# Patient Record
Sex: Female | Born: 1990 | Race: White | Hispanic: No | Marital: Single | Smoking: Never smoker
Health system: Southern US, Community
[De-identification: ages and names within clinical notes are randomized; demographics above are authoritative.]

## PROBLEM LIST (undated history)

## (undated) DIAGNOSIS — F32A Depression, unspecified: Secondary | ICD-10-CM

## (undated) DIAGNOSIS — T7840XA Allergy, unspecified, initial encounter: Secondary | ICD-10-CM

## (undated) DIAGNOSIS — E119 Type 2 diabetes mellitus without complications: Secondary | ICD-10-CM

## (undated) DIAGNOSIS — E282 Polycystic ovarian syndrome: Secondary | ICD-10-CM

## (undated) DIAGNOSIS — F329 Major depressive disorder, single episode, unspecified: Secondary | ICD-10-CM

## (undated) DIAGNOSIS — F419 Anxiety disorder, unspecified: Secondary | ICD-10-CM

## (undated) HISTORY — DX: Major depressive disorder, single episode, unspecified: F32.9

## (undated) HISTORY — DX: Depression, unspecified: F32.A

## (undated) HISTORY — DX: Allergy, unspecified, initial encounter: T78.40XA

## (undated) HISTORY — PX: DENTAL SURGERY: SHX609

## (undated) HISTORY — DX: Anxiety disorder, unspecified: F41.9

---
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
## 2012-01-12 ENCOUNTER — Encounter (HOSPITAL_COMMUNITY): Payer: Self-pay | Admitting: Emergency Medicine

## 2012-01-12 ENCOUNTER — Emergency Department (HOSPITAL_COMMUNITY)
Admission: EM | Admit: 2012-01-12 | Discharge: 2012-01-12 | Disposition: A | Discharge status: Home / Self Care | Payer: BC Managed Care – PPO | Attending: Emergency Medicine | Admitting: Emergency Medicine

## 2012-01-12 DIAGNOSIS — R112 Nausea with vomiting, unspecified: Secondary | ICD-10-CM

## 2012-01-12 DIAGNOSIS — R197 Diarrhea, unspecified: Secondary | ICD-10-CM | POA: Insufficient documentation

## 2012-01-12 DIAGNOSIS — R109 Unspecified abdominal pain: Secondary | ICD-10-CM | POA: Insufficient documentation

## 2012-01-12 DIAGNOSIS — Z8659 Personal history of other mental and behavioral disorders: Secondary | ICD-10-CM | POA: Insufficient documentation

## 2012-01-12 LAB — COMPREHENSIVE METABOLIC PANEL
BUN: 14 mg/dL (ref 6–23)
CO2: 22 mEq/L (ref 19–32)
Calcium: 9.4 mg/dL (ref 8.4–10.5)
Creatinine, Ser: 0.69 mg/dL (ref 0.50–1.10)
GFR calc Af Amer: >90 mL/min (ref >90)
GFR calc non Af Amer: >90 mL/min (ref >90)
Glucose, Bld: 144 mg/dL — ABNORMAL HIGH (ref 70–99)
Total Bilirubin: 0.6 mg/dL (ref 0.3–1.2)

## 2012-01-12 LAB — CBC WITH DIFFERENTIAL/PLATELET
Basophils Absolute: 0 10*3/uL (ref 0.0–0.1)
Eosinophils Absolute: 0 10*3/uL (ref 0.0–0.7)
Lymphocytes Relative: 6 % — ABNORMAL LOW (ref 12–46)
Lymphs Abs: 1.4 10*3/uL (ref 0.7–4.0)
Neutrophils Relative %: 90 % — ABNORMAL HIGH (ref 43–77)
Platelets: 285 10*3/uL (ref 150–400)
RBC: 5.33 MIL/uL — ABNORMAL HIGH (ref 3.87–5.11)
RDW: 13.5 % (ref 11.5–15.5)
WBC: 22 10*3/uL — ABNORMAL HIGH (ref 4.0–10.5)

## 2012-01-12 LAB — URINE MICROSCOPIC-ADD ON

## 2012-01-12 LAB — PREGNANCY, URINE: Preg Test, Ur: NEGATIVE

## 2012-01-12 LAB — URINALYSIS, ROUTINE W REFLEX MICROSCOPIC
Glucose, UA: NEGATIVE mg/dL
Hgb urine dipstick: NEGATIVE
Protein, ur: NEGATIVE mg/dL

## 2012-01-12 LAB — OCCULT BLOOD, POC DEVICE: Fecal Occult Bld: NEGATIVE

## 2012-01-12 MED ORDER — ONDANSETRON HCL 4 MG/2ML IJ SOLN
4.0000 mg | Freq: Once | INTRAMUSCULAR | Status: AC
  Administered 2012-01-12: 4 mg via INTRAVENOUS
  Filled 2012-01-12: qty 2

## 2012-01-12 MED ORDER — SODIUM CHLORIDE 0.9 % IV BOLUS (SEPSIS)
1000.0000 mL | Freq: Once | INTRAVENOUS | Status: AC
  Administered 2012-01-12: 1000 mL via INTRAVENOUS

## 2012-01-12 MED ORDER — ONDANSETRON 4 MG PO TBDP
4.0000 mg | ORAL_TABLET | Freq: Once | ORAL | Status: AC
  Administered 2012-01-12: 4 mg via ORAL
  Filled 2012-01-12: qty 1

## 2012-01-12 MED ORDER — MORPHINE SULFATE 4 MG/ML IJ SOLN
4.0000 mg | Freq: Once | INTRAMUSCULAR | Status: AC
  Administered 2012-01-12: 4 mg via INTRAVENOUS
  Filled 2012-01-12: qty 1

## 2012-01-12 NOTE — ED Notes (Signed)
Pt has active vomiting at this time; also has uncontrolled loose stool--- mother assisted pt to the BR.

## 2012-01-12 NOTE — ED Notes (Signed)
Pt was offered ginger ale at this time.

## 2012-01-12 NOTE — ED Notes (Signed)
Pt has been slowly taking sips of the ginger ale--- encouraged to drink more to monitor po tolerance.

## 2012-01-12 NOTE — ED Notes (Signed)
Pt drank the majority of the ginger ale, tolerated well; denies nausea. Pt states she feels better, states abdominal pain is "almost gone".

## 2012-01-12 NOTE — ED Provider Notes (Signed)
History     CSN: [id number]  Arrival date & time 01/12/12  0208   First MD Initiated Contact with Patient 01/12/12 0304      Chief Complaint  Patient presents with  . Nausea and vomiting     (Consider location/radiation/quality/duration/timing/severity/associated sxs/prior treatment) HPI Comments: Multiple episodes of vomiting in the past 3 hours several episodes of diarrhea several hours ago none since.   The history is provided by the patient.    Past Medical History  Diagnosis Date  . Allergy   . Depression     Past Surgical History  Procedure Date  . Fracture surgery 2006    right wrist, [facility]     Family History  Problem Relation Age of Onset  . Hypertension Mother   . Hyperlipidemia Mother   . Depression Mother   . Cholelithiasis Mother   . Diabetes Maternal Grandmother   . Heart disease Maternal Grandmother     CVA  . Diabetes Paternal Grandmother   . Coronary artery disease Paternal Grandfather   . Cancer Cousin     Leukemia    History  Substance Use Topics  . Smoking status: Never Smoker   . Smokeless tobacco: Not on file  . Alcohol Use: Not on file    OB History    Grav Para Term Preterm Abortions TAB SAB Ect Mult Living                  Review of Systems  Constitutional: Negative for fever and chills.  HENT: Negative.   Eyes: Negative.   Respiratory: Negative for shortness of breath.   Cardiovascular: Negative for chest pain.  Gastrointestinal: Positive for abdominal pain.  Genitourinary: Negative.   Skin: Negative for rash.  Neurological: Negative for dizziness.  Hematological: Negative.     Allergies  Penicillins  Home Medications  No current outpatient prescriptions on file.  BP 102/62  Pulse 82  Temp 98.3 F (36.8 C) (Oral)  Resp 18  SpO2 100%  Physical Exam  Constitutional: She is oriented to person, place, and time. She appears well-developed and well-nourished.  HENT:  Head: Normocephalic.  Eyes: Pupils are  equal, round, and reactive to light.  Neck: Normal range of motion.  Cardiovascular: Tachycardia present.   Pulmonary/Chest: Effort normal.  Abdominal: Soft. She exhibits no distension. There is no tenderness.  Musculoskeletal: Normal range of motion.  Neurological: She is alert and oriented to person, place, and time.  Skin: Skin is warm. There is pallor.    ED Course  Procedures (including critical care time)  Labs Reviewed  URINALYSIS, ROUTINE W REFLEX MICROSCOPIC - Abnormal; Notable for the following:    APPearance TURBID (*)     Specific Gravity, Urine 1.031 (*)     Bilirubin Urine SMALL (*)     Ketones, ur TRACE (*)     All other components within normal limits  COMPREHENSIVE METABOLIC PANEL - Abnormal; Notable for the following:    Sodium 133 (*)     Glucose, Bld 144 (*)     All other components within normal limits  CBC WITH DIFFERENTIAL - Abnormal; Notable for the following:    WBC 22.0 (*)     RBC 5.33 (*)     Neutrophils Relative 90 (*)     Neutro Abs 19.7 (*)     Lymphocytes Relative 6 (*)     All other components within normal limits  URINE MICROSCOPIC-ADD ON - Abnormal; Notable for the following:    Squamous  Epithelial / LPF FEW (*)     Bacteria, UA MANY (*)     All other components within normal limits  LIPASE, BLOOD  PREGNANCY, URINE  OCCULT BLOOD, POC DEVICE  LAB REPORT - SCANNED   No results found.   1. Nausea, vomiting, and diarrhea       MDM  Tolerating PO fluids        Arman Filter, NP 01/13/12 [phone]

## 2012-01-12 NOTE — ED Notes (Signed)
Pt alert, arrives from home, c/o nausea and emesis, onset this evening, denies recent ill exposures, resp even unlabored, skin pwd, emesis in triage, clear, in appearance

## 2012-01-12 NOTE — ED Notes (Signed)
Noted streaks of blood in pt's loose stool.

## 2012-01-12 NOTE — ED Provider Notes (Signed)
Received signout from Sabino Dick, NP.  He generally healthy 22 year old female presenting complaining of nausea vomiting and diarrhea which started this morning. She reports feeling sick to his stomach, had more than 5 bouts of nonbloody nonbilious vomitus, and 2 bouts of diarrhea. She also endorsed sharp, crampy diffuse abdominal pain.  Her symptoms has improved after receiving IV fluid. On exam her abdomen is soft, without focal point tenderness. Abdomen is nonsurgical. No hernia noted. No overlying skin changes noted. Patient appears nontoxic. Her labs are significant with a white count of 22 and a left shift. However her abdomen is not suggestive of appendicitis or other life-threatening emergencies.  Goal continue to monitor patient, continue IV fluid, and will reexamine patient. Patient is currently tolerates by mouth, and has an appetite.  Care discussed with my attending.    9:05 AM Pt felt better, able to tolerates PO.  Abdomen nontender on reexamination.  Pt has PCP which i encourage to f/u for further evaluation of her leukocytosis.  Otherwise pt stable for discharge.    BP 90/44  Pulse 86  Temp 98 F (36.7 C) (Oral)  Resp 21  SpO2 99%  I have reviewed nursing notes and vital signs. I personally reviewed the imaging tests through PACS system  I reviewed available ER/hospitalization records thought the EMR  Results for orders placed during the hospital encounter of 01/12/12  URINALYSIS, ROUTINE W REFLEX MICROSCOPIC      Component Value Range   Color, Urine YELLOW  YELLOW   APPearance TURBID (*) CLEAR   Specific Gravity, Urine 1.031 (*) 1.005 - 1.030   pH 5.5  5.0 - 8.0   Glucose, UA NEGATIVE  NEGATIVE mg/dL   Hgb urine dipstick NEGATIVE  NEGATIVE   Bilirubin Urine SMALL (*) NEGATIVE   Ketones, ur TRACE (*) NEGATIVE mg/dL   Protein, ur NEGATIVE  NEGATIVE mg/dL   Urobilinogen, UA 0.2  0.0 - 1.0 mg/dL   Nitrite NEGATIVE  NEGATIVE   Leukocytes, UA NEGATIVE  NEGATIVE    COMPREHENSIVE METABOLIC PANEL      Component Value Range   Sodium 133 (*) 135 - 145 mEq/L   Potassium 4.0  3.5 - 5.1 mEq/L   Chloride 98  96 - 112 mEq/L   CO2 22  19 - 32 mEq/L   Glucose, Bld 144 (*) 70 - 99 mg/dL   BUN 14  6 - 23 mg/dL   Creatinine, Ser 1.61  0.50 - 1.10 mg/dL   Calcium 9.4  8.4 - 09.6 mg/dL   Total Protein 7.7  6.0 - 8.3 g/dL   Albumin 4.0  3.5 - 5.2 g/dL   AST 16  0 - 37 U/L   ALT 7  0 - 35 U/L   Alkaline Phosphatase 83  39 - 117 U/L   Total Bilirubin 0.6  0.3 - 1.2 mg/dL   GFR calc non Af Amer >90  >90 mL/min   GFR calc Af Amer >90  >90 mL/min  LIPASE, BLOOD      Component Value Range   Lipase 28  11 - 59 U/L  CBC WITH DIFFERENTIAL      Component Value Range   WBC 22.0 (*) 4.0 - 10.5 K/uL   RBC 5.33 (*) 3.87 - 5.11 MIL/uL   Hemoglobin 14.9  12.0 - 15.0 g/dL   HCT 04.5  40.9 - 81.1 %   MCV 81.2  78.0 - 100.0 fL   MCH 28.0  26.0 - 34.0 pg   MCHC 34.4  30.0 - 36.0 g/dL   RDW 40.9  81.1 - 91.4 %   Platelets 285  150 - 400 K/uL   Neutrophils Relative 90 (*) 43 - 77 %   Neutro Abs 19.7 (*) 1.7 - 7.7 K/uL   Lymphocytes Relative 6 (*) 12 - 46 %   Lymphs Abs 1.4  0.7 - 4.0 K/uL   Monocytes Relative 4  3 - 12 %   Monocytes Absolute 0.9  0.1 - 1.0 K/uL   Eosinophils Relative 0  0 - 5 %   Eosinophils Absolute 0.0  0.0 - 0.7 K/uL   Basophils Relative 0  0 - 1 %   Basophils Absolute 0.0  0.0 - 0.1 K/uL  PREGNANCY, URINE      Component Value Range   Preg Test, Ur NEGATIVE  NEGATIVE  URINE MICROSCOPIC-ADD ON      Component Value Range   Squamous Epithelial / LPF FEW (*) RARE   Bacteria, UA MANY (*) RARE  OCCULT BLOOD, POC DEVICE      Component Value Range   Fecal Occult Bld NEGATIVE  NEGATIVE   No results found.    Fayrene Helper, PA-C 01/12/12 [phone]

## 2012-01-13 NOTE — ED Provider Notes (Signed)
Medical screening examination/treatment/procedure(s) were performed by non-physician practitioner and as supervising physician I was immediately available for consultation/collaboration.   Hanley Seamen, MD 01/13/12 2007

## 2012-09-11 ENCOUNTER — Ambulatory Visit (INDEPENDENT_AMBULATORY_CARE_PROVIDER_SITE_OTHER): Payer: BC Managed Care – PPO | Admitting: Family Medicine

## 2012-09-11 ENCOUNTER — Encounter: Payer: Self-pay | Admitting: Family Medicine

## 2012-09-11 VITALS — BP 112/82 | HR 77 | Temp 97.7°F | Ht 68.5 in | Wt 253.5 lb

## 2012-09-11 DIAGNOSIS — L700 Acne vulgaris: Secondary | ICD-10-CM | POA: Insufficient documentation

## 2012-09-11 DIAGNOSIS — L708 Other acne: Secondary | ICD-10-CM

## 2012-09-11 DIAGNOSIS — J069 Acute upper respiratory infection, unspecified: Secondary | ICD-10-CM | POA: Insufficient documentation

## 2012-09-11 NOTE — Assessment & Plan Note (Signed)
On hairline /forehead Disc use of greasy hair products - to use caution and cleanse forehead well after use (suggest salicylic acid cleanser) Try not to touch face  Update if not improved  Avoid hats and headbands

## 2012-09-11 NOTE — Progress Notes (Signed)
Subjective:    Patient ID: Samantha Ramos, female    DOB: 10-17-1990, 22 y.o.   MRN: [id number]  HPI Here with sinus problems  Mid last week - really sore throat - used chloraseptic spray and tylenol cold and flu  This week very stuffy nose and drainage (taking claritin)  Yesterday- green nasal discharge -today not as bad and is clear  May have had a fever one day (hot and cold)  Mother had symptoms the week before   Rash started at the hairline -- forehead and sides of face Usually does not get breakouts there  Uses a light foundation Avoids the sun  Not a lot of acne in the past  No pustules or cysts   No new products  Uses suave shampoo and conditioner  Uses a fructis product for frizziness  Does not wear hats  No sweatbands   Patient Active Problem List   Diagnosis Date Noted  . Viral upper respiratory infection 09/11/2012  . 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 URI (upper respiratory infection) 05/11/2011  . Abdominal pain 12/19/2010  . Chest pain 12/19/2010  . ANXIETY STATE, UNSPECIFIED 12/21/2009  . VISUAL CHANGES 12/21/2009  . VITAMIN D DEFICIENCY 03/19/2009  . ALLERGIC RHINITIS 05/06/2008  . VASOVAGAL SYNCOPE 05/06/2008  . OTHER ABNORMALITY OF URINATION 05/06/2008  . DYSPLASTIC NEVUS, BACK 06/13/2007  . ANXIETY, SITUATIONAL 05/10/2007  . NEOPLASM, SKIN, UNCERTAIN BEHAVIOR 02/14/2007  . OVERWEIGHT 02/14/2007  . DEPRESSION 02/14/2007  . COMMON MIGRAINE 02/14/2007   Past Medical History  Diagnosis Date  . Allergy   . Depression    Past Surgical History  Procedure Laterality Date  . Fracture surgery  2006    right wrist, [facility]    History  Substance Use Topics  . Smoking status: Never Smoker   . Smokeless tobacco: Not on file  . Alcohol Use: No   Family History  Problem Relation Age of Onset  . Hypertension Mother   . Hyperlipidemia Mother   . Depression Mother   . Cholelithiasis Mother   . Diabetes Maternal Grandmother   . Heart disease Maternal  Grandmother     CVA  . Diabetes Paternal Grandmother   . Coronary artery disease Paternal Grandfather   . Cancer Cousin     Leukemia   Allergies  Allergen Reactions  . Penicillins     REACTION: Rash   No current outpatient prescriptions on file prior to visit.   No current facility-administered medications on file prior to visit.    Review of Systems Review of Systems  Constitutional: Negative for fever, appetite change,  and unexpected weight change.  ENT pos for cong/rhinorrhea and sinus fullness/ neg for ear pain or drainage  Eyes: Negative for pain and visual disturbance.  Respiratory: Negative for wheeze and shortness of breath.  pos for slight dry cough Cardiovascular: Negative for cp or palpitations    Gastrointestinal: Negative for nausea, diarrhea and constipation.  Genitourinary: Negative for urgency and frequency.  Skin: Negative for pallor and pos for acne type rash on forehead    Neurological: Negative for weakness, light-headedness, numbness and headaches.  Hematological: Negative for adenopathy. Does not bruise/bleed easily.  Psychiatric/Behavioral: Negative for dysphoric mood. The patient is not nervous/anxious.         Objective:   Physical Exam  Constitutional: She appears well-developed and well-nourished. No distress.  obese and well appearing   HENT:  Head: Normocephalic and atraumatic.  Right Ear: External ear normal.  Left Ear: External ear normal.  Mouth/Throat: Oropharynx is clear and moist.  Nares are injected and congested  Mild maxillary sinus tenderness Throat clear   Eyes: Conjunctivae and EOM are normal. Pupils are equal, round, and reactive to light. Right eye exhibits no discharge. Left eye exhibits no discharge.  Neck: Normal range of motion. Neck supple.  Cardiovascular: Normal rate and regular rhythm.   Pulmonary/Chest: Breath sounds normal. No respiratory distress. She has no wheezes. She has no rales.  Lymphadenopathy:    She has  no cervical adenopathy.  Neurological: She is alert.  Skin: Skin is warm and dry. No pallor.  Comedones on forehead - with scant erythema  No pustules  On hairline only Scalp is clear     Psychiatric: She has a normal mood and affect.          Assessment & Plan:

## 2012-09-11 NOTE — Assessment & Plan Note (Signed)
With reassuring exam  Disc symptomatic care - see instructions on AVS - will continue mucinex D and claritin since they help Is already improved School note for yesterday and today Update if not starting to improve in a week or if worsening

## 2012-09-11 NOTE — Patient Instructions (Addendum)
For viral upper resp infection (cold) - drink lots of fluids and rest and continue mucinex D and also claritin if helpful  Nasal saline spray is also helpful  For forehead- I think you have acne / clogged pores -think about changing your hair product or wash forehead well after you do your hair  Use a product with salicylic acid  If no improvement please let me know

## 2012-12-28 ENCOUNTER — Ambulatory Visit (INDEPENDENT_AMBULATORY_CARE_PROVIDER_SITE_OTHER): Payer: BC Managed Care – PPO | Admitting: Internal Medicine

## 2012-12-28 VITALS — BP 118/76 | HR 77 | Temp 97.8°F | Resp 18 | Ht 69.0 in | Wt 257.0 lb

## 2012-12-28 DIAGNOSIS — R5381 Other malaise: Secondary | ICD-10-CM

## 2012-12-28 DIAGNOSIS — R05 Cough: Secondary | ICD-10-CM

## 2012-12-28 DIAGNOSIS — J040 Acute laryngitis: Secondary | ICD-10-CM

## 2012-12-28 DIAGNOSIS — R635 Abnormal weight gain: Secondary | ICD-10-CM

## 2012-12-28 LAB — GLUCOSE, POCT (MANUAL RESULT ENTRY): POC Glucose: 102 mg/dl — AB (ref 70–99)

## 2012-12-28 LAB — POCT CBC
Granulocyte percent: 68.8 %G (ref 37–80)
Hemoglobin: 13.9 g/dL (ref 12.2–16.2)
MCH, POC: 27.6 pg (ref 27–31.2)
MID (cbc): 0.9 (ref 0–0.9)
MPV: 9.9 fL (ref 0–99.8)
POC MID %: 8.1 %M (ref 0–12)
Platelet Count, POC: 307 10*3/uL (ref 142–424)
RBC: 5.04 M/uL (ref 4.04–5.48)
WBC: 11.3 10*3/uL — AB (ref 4.6–10.2)

## 2012-12-28 LAB — POCT GLYCOSYLATED HEMOGLOBIN (HGB A1C): Hemoglobin A1C: 6.3

## 2012-12-28 LAB — TSH: TSH: 2.112 u[IU]/mL (ref 0.350–4.500)

## 2012-12-28 MED ORDER — HYDROCODONE-ACETAMINOPHEN 5-325 MG PO TABS: 1.0000 | ORAL_TABLET | Freq: Four times a day (QID) | ORAL | Status: DC | PRN

## 2012-12-28 MED ORDER — AZITHROMYCIN 250 MG PO TABS: ORAL_TABLET | ORAL | Status: DC

## 2012-12-28 NOTE — Patient Instructions (Signed)
Laryngitis At the top of your windpipe is your voice box. It is the source of your voice. Inside your voice box are 2 bands of muscles called vocal cords. When you breathe, your vocal cords are relaxed and open so that air can get into the lungs. When you decide to say something, these cords come together and vibrate. The sound from these vibrations goes into your throat and comes out through your mouth as sound. Laryngitis is an inflammation of the vocal cords that causes hoarseness, cough, loss of voice, sore throat, and dry throat. Laryngitis can be temporary (acute) or long-term (chronic). Most cases of acute laryngitis improve with time.Chronic laryngitis lasts for more than 3 weeks. CAUSES Laryngitis can often be related to excessive smoking, talking, or yelling, as well as inhalation of toxic fumes and allergies. Acute laryngitis is usually caused by a viral infection, vocal strain, measles or mumps, or bacterial infections. Chronic laryngitis is usually caused by vocal cord strain, vocal cord injury, postnasal drip, growths on the vocal cords, or acid reflux. SYMPTOMS   Cough.  Sore throat.  Dry throat. RISK FACTORS  Respiratory infections.  Exposure to irritating substances, such as cigarette smoke, excessive amounts of alcohol, stomach acids, and workplace chemicals.  Voice trauma, such as vocal cord injury from shouting or speaking too loud. DIAGNOSIS  Your cargiver will perform a physical exam. During the physical exam, your caregiver will examine your throat. The most common sign of laryngitis is hoarseness. Laryngoscopy may be necessary to confirm the diagnosis of this condition. This procedure allows your caregiver to look into the larynx. HOME CARE INSTRUCTIONS  Drink enough fluids to keep your urine clear or pale yellow.  Rest until you no longer have symptoms or as directed by your caregiver.  Breathe in moist air.  Take all medicine as directed by your  caregiver.  Do not smoke.  Talk as little as possible (this includes whispering).  Write on paper instead of talking until your voice is back to normal.  Follow up with your caregiver if your condition has not improved after 10 days. SEEK MEDICAL CARE IF:   You have trouble breathing.  You cough up blood.  You have persistent fever.  You have increasing pain.  You have difficulty swallowing. MAKE SURE YOU:  Understand these instructions.  Will watch your condition.  Will get help right away if you are not doing well or get worse. Document Released: 12/26/2004 Document Revised: 03/20/2011 Document Reviewed: 03/03/2010 [facility] Patient Information 2014 [place], [place]. Hypothyroidism The thyroid is a large gland located in the lower front of your neck. The thyroid gland helps control metabolism. Metabolism is how your body handles food. It controls metabolism with the hormone thyroxine. When this gland is underactive (hypothyroid), it produces too little hormone.  CAUSES These include:   Absence or destruction of thyroid tissue.  Goiter due to iodine deficiency.  Goiter due to medications.  Congenital defects (since birth).  Problems with the pituitary. This causes a lack of TSH (thyroid stimulating hormone). This hormone tells the thyroid to turn out more hormone. SYMPTOMS  Lethargy (feeling as though you have no energy)  Cold intolerance  Weight gain (in spite of normal food intake)  Dry skin  Coarse hair  Menstrual irregularity (if severe, may lead to infertility)  Slowing of thought processes Cardiac problems are also caused by insufficient amounts of thyroid hormone. Hypothyroidism in the newborn is cretinism, and is an extreme form. It is important that this  form be treated adequately and immediately or it will lead rapidly to retarded physical and mental development. DIAGNOSIS  To prove hypothyroidism, your caregiver may do blood tests and ultrasound  tests. Sometimes the signs are hidden. It may be necessary for your caregiver to watch this illness with blood tests either before or after diagnosis and treatment. TREATMENT  Low levels of thyroid hormone are increased by using synthetic thyroid hormone. This is a safe, effective treatment. It usually takes about four weeks to gain the full effects of the medication. After you have the full effect of the medication, it will generally take another four weeks for problems to leave. Your caregiver may start you on low doses. If you have had heart problems the dose may be gradually increased. It is generally not an emergency to get rapidly to normal. HOME CARE INSTRUCTIONS   Take your medications as your caregiver suggests. Let your caregiver know of any medications you are taking or start taking. Your caregiver will help you with dosage schedules.  As your condition improves, your dosage needs may increase. It will be necessary to have continuing blood tests as suggested by your caregiver.  Report all suspected medication side effects to your caregiver. SEEK MEDICAL CARE IF: Seek medical care if you develop:  Sweating.  Tremulousness (tremors).  Anxiety.  Rapid weight loss.  Heat intolerance.  Emotional swings.  Diarrhea.  Weakness. SEEK IMMEDIATE MEDICAL CARE IF:  You develop chest pain, an irregular heart beat (palpitations), or a rapid heart beat. MAKE SURE YOU:   Understand these instructions.  Will watch your condition.  Will get help right away if you are not doing well or get worse. Document Released: 12/26/2004 Document Revised: 03/20/2011 Document Reviewed: 08/16/2007 [facility] Patient Information 2014 [place], [place].

## 2012-12-28 NOTE — Progress Notes (Signed)
   Subjective:    Patient ID: Samantha Ramos, female    DOB: 1990-10-29, 22 y.o.   MRN: [id number]  HPI Has hoarseness, congestion, ear pain ,ST. No sob,cp. Also has weight gain and fatigue and fhx of low thyroid.   Review of Systems     Objective:   Physical Exam  Vitals reviewed. Constitutional: She is oriented to person, place, and time. She appears well-nourished. No distress.  HENT:  Head: Normocephalic.  Right Ear: External ear and ear canal normal. No drainage, swelling or tenderness. No mastoid tenderness. Tympanic membrane is retracted. Tympanic membrane is not injected and not erythematous. A middle ear effusion is present. Decreased hearing is noted.  Left Ear: Hearing, tympanic membrane, external ear and ear canal normal.  Nose: Mucosal edema, rhinorrhea and sinus tenderness present. No nasal deformity. Right sinus exhibits no maxillary sinus tenderness and no frontal sinus tenderness. Left sinus exhibits no maxillary sinus tenderness and no frontal sinus tenderness.  Eyes: EOM are normal.  Neck: Normal range of motion. Neck supple. No tracheal deviation present. No thyromegaly present.  Cardiovascular: Normal rate and normal heart sounds.   Pulmonary/Chest: Effort normal and breath sounds normal.  Lymphadenopathy:    She has no cervical adenopathy.  Neurological: She is alert and oriented to person, place, and time. Coordination normal.  Skin: No rash noted.  Psychiatric: She has a normal mood and affect.   Results for orders placed in visit on 12/28/12  POCT CBC      Result Value Range   WBC 11.3 (*) 4.6 - 10.2 K/uL   Lymph, poc 2.6  0.6 - 3.4   POC LYMPH PERCENT 23.1  10 - 50 %L   MID (cbc) 0.9  0 - 0.9   POC MID % 8.1  0 - 12 %M   POC Granulocyte 7.8 (*) 2 - 6.9   Granulocyte percent 68.8  37 - 80 %G   RBC 5.04  4.04 - 5.48 M/uL   Hemoglobin 13.9  12.2 - 16.2 g/dL   HCT, POC 40.9  81.1 - 47.9 %   MCV 87.1  80 - 97 fL   MCH, POC 27.6  27 - 31.2 pg   MCHC  31.7 (*) 31.8 - 35.4 g/dL   RDW, POC 91.4     Platelet Count, POC 307  142 - 424 K/uL   MPV 9.9  0 - 99.8 fL  GLUCOSE, POCT (MANUAL RESULT ENTRY)      Result Value Range   POC Glucose 102 (*) 70 - 99 mg/dl  POCT GLYCOSYLATED HEMOGLOBIN (HGB A1C)      Result Value Range   Hemoglobin A1C 6.3            Assessment & Plan:  Laryngitis/Rhinitis Weight gain/Fatigue Zpak/hc

## 2012-12-30 ENCOUNTER — Encounter: Payer: Self-pay | Admitting: Radiology

## 2013-03-22 ENCOUNTER — Ambulatory Visit: Payer: BC Managed Care – PPO

## 2013-03-22 ENCOUNTER — Ambulatory Visit (INDEPENDENT_AMBULATORY_CARE_PROVIDER_SITE_OTHER): Payer: BC Managed Care – PPO | Admitting: Family Medicine

## 2013-03-22 VITALS — BP 104/76 | HR 72 | Temp 98.4°F | Resp 17 | Ht 69.25 in | Wt 262.0 lb

## 2013-03-22 DIAGNOSIS — S83249A Other tear of medial meniscus, current injury, unspecified knee, initial encounter: Secondary | ICD-10-CM

## 2013-03-22 DIAGNOSIS — M25561 Pain in right knee: Secondary | ICD-10-CM

## 2013-03-22 DIAGNOSIS — IMO0002 Reserved for concepts with insufficient information to code with codable children: Secondary | ICD-10-CM

## 2013-03-22 DIAGNOSIS — M25569 Pain in unspecified knee: Secondary | ICD-10-CM

## 2013-03-22 DIAGNOSIS — E669 Obesity, unspecified: Secondary | ICD-10-CM

## 2013-03-22 MED ORDER — DICLOFENAC SODIUM 75 MG PO TBEC: 75.0000 mg | DELAYED_RELEASE_TABLET | Freq: Two times a day (BID) | ORAL | Status: DC

## 2013-03-22 NOTE — Progress Notes (Signed)
Subjective: A year ago the patient fell and injured her left knee. She's had several more instances of getting worse and more painful. She denies having problems with it catching and giving way. 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 She's on her feet a lot. She is constantly more overweight than the last couple of years. She realizes that is a problem. She has been wearing a brace the last few days on Monday.  Objective: Knees look the same. No effusion palpable. She's very tender just below the patella medially. Medial stressing of the knee causes significant pain. Mild crepitance and popping could be palpated  [facility] reading (PRIMARY) by  Dr. Linna Darner Mild medial narrowing  Assessment: Right knee pain Suspicion of torn meniscus. Obesity  Plan: Refer to orthopedics Diclofenac Wear the knee brace that she has

## 2013-03-22 NOTE — Patient Instructions (Signed)
Wear your knee brace  Diclofenac one twice daily  Referral is being made to orthopedist  Return if worse

## 2013-03-28 ENCOUNTER — Telehealth: Payer: Self-pay

## 2013-03-28 NOTE — Telephone Encounter (Signed)
Advised pt that it was OK to take Tylenol but to avoid Aleve and Ibuprofen

## 2013-03-28 NOTE — Telephone Encounter (Signed)
Wants to know if she can take Tylenol with the medication prescribed for her knee injury.  She just started her period.  [phone]

## 2013-03-31 ENCOUNTER — Telehealth: Payer: Self-pay

## 2013-03-31 NOTE — Telephone Encounter (Signed)
Lm for rtn call 

## 2013-03-31 NOTE — Telephone Encounter (Signed)
Patient is calling to return Sara's call, please call back after 3:30 or 4pm when patient is off work.   Best: [phone]

## 2013-03-31 NOTE — Telephone Encounter (Signed)
Patient is calling

## 2013-03-31 NOTE — Telephone Encounter (Signed)
PT STATES THE MEDICINE SHE WAS GIVEN ISN'T HELPING AND WOULD LIKE TO HAVE SOMETHING ELSE CALLED IN. PLEASE CALL [place] AND [address]

## 2013-04-01 NOTE — Telephone Encounter (Signed)
Refer to a different ortho.

## 2013-04-01 NOTE — Telephone Encounter (Signed)
Spoke with pt. Says she is still in a lot of pain. Unable to get in to Dr. Luanna Cole due to owing them a balance. Wants to know if we can send her to another ortho and if we can send her in something else for pain in the meantime. Says the anti-inflammatories are not really helping. Thanks

## 2013-04-01 NOTE — Telephone Encounter (Signed)
See other phone message  

## 2013-04-06 ENCOUNTER — Telehealth: Payer: Self-pay | Admitting: Family Medicine

## 2013-04-07 NOTE — Telephone Encounter (Signed)
LMOM for her to CB to check status

## 2013-04-08 NOTE — Telephone Encounter (Signed)
Pt needs referral to a different office since she owes a balance at the office we had originally referred her to.

## 2013-04-12 NOTE — Telephone Encounter (Signed)
Refer to ortho of choice

## 2013-08-25 ENCOUNTER — Encounter: Payer: Self-pay | Admitting: Family Medicine

## 2013-08-25 ENCOUNTER — Ambulatory Visit (INDEPENDENT_AMBULATORY_CARE_PROVIDER_SITE_OTHER): Payer: BC Managed Care – PPO | Admitting: Family Medicine

## 2013-08-25 VITALS — BP 110/70 | HR 82 | Temp 97.4°F | Ht 69.0 in | Wt 263.2 lb

## 2013-08-25 DIAGNOSIS — F321 Major depressive disorder, single episode, moderate: Secondary | ICD-10-CM

## 2013-08-25 MED ORDER — FLUOXETINE HCL 20 MG PO TABS: 20.0000 mg | ORAL_TABLET | Freq: Every day | ORAL | Status: DC

## 2013-08-25 NOTE — Progress Notes (Signed)
Pre visit review using our clinic review tool, if applicable. No additional management support is needed unless otherwise documented below in the visit note. 

## 2013-08-25 NOTE — Progress Notes (Signed)
   08/25/2013    ID: DEBIE ASHLINE   MRN: [id number]  DOB: 1990/11/07  Primary Physician:  Eliezer Lofts, MD  Chief Complaint: Diarrhea  Subjective:   History of Present Illness:  Samantha Ramos is a 23 y.o. very pleasant female patient who presents with the following:  Some depression, unsure about which major. Works at [employer].   Not much sleep. 4-5 hours of sleep.  ? Interest. Some guilt.  Some guilt.   Wt Readings from Last 3 Encounters:  08/25/13 263 lb 4 oz (119.409 kg)  03/22/13 262 lb (118.842 kg)  12/28/12 257 lb (116.574 kg)    No drugs.   Last period in July.  Some financial stress.  ? No family psych admissions.   Past Medical History, Surgical History, Social History, Family History, Problem List, Medications, and Allergies have been reviewed and updated if relevant.  Review of Systems:  GEN: No acute illnesses, no fevers, chills. GI: No n/v/d, eating normally Pulm: No SOB Interactive and getting along well at home.  Otherwise, ROS is as per the HPI.  Objective:   Physical Examination: BP 110/70  Pulse 82  Temp(Src) 97.4 F (36.3 C) (Oral)  Ht 5\' 9"  (1.753 m)  Wt 263 lb 4 oz (119.409 kg)  BMI 38.86 kg/m2  LMP 08/11/2013   GEN: WDWN, NAD, Non-toxic, A & O x 3 HEENT: Atraumatic, Normocephalic. Neck supple. No masses, No LAD. Ears and Nose: No external deformity. CV: RRR, No M/G/R. No JVD. No thrill. No extra heart sounds. PULM: CTA B, no wheezes, crackles, rhonchi. No retractions. No resp. distress. No accessory muscle use. EXTR: No c/c/e NEURO Normal gait.  PSYCH: FLAT AFFECT   Laboratory and Imaging Data:  Assessment & Plan:   Major depressive disorder, single episode, moderate  >25 minutes spent in face to face time with patient, >50% spent in counselling or coordination of care: the patient is having some significant changes recently. She complains ofdifficulty sleeping, waking up during the night. She is sleeping  approximately 4-5 hours nightly. No SI or HI. Decreased interest in energy. She also appears to have some psychomotor retardation on exam.  She has had some stress, anxiety, and depression in the past. She lives this is gotten worse recently. She is open to treatment and medications. 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  For now recommended increased exercise and begin Prozac. Followup with primary care provider in 6 weeks.  Follow-up: Return in about 6 weeks (around 10/06/2013).  New Prescriptions   FLUOXETINE (PROZAC) 20 MG TABLET    Take 1 tablet (20 mg total) by mouth daily.   No orders of the defined types were placed in this encounter.    Signed,  Samantha Deed. Brogan Martis, MD, [facility] [place], [address] Phone: [phone] Fax: [phone]   Patient's Medications  New Prescriptions   FLUOXETINE (PROZAC) 20 MG TABLET    Take 1 tablet (20 mg total) by mouth daily.  Previous Medications   DICLOFENAC (VOLTAREN) 75 MG EC TABLET    Take 1 tablet (75 mg total) by mouth 2 (two) times daily.   IBUPROFEN (ADVIL,MOTRIN) 200 MG TABLET    Take 400 mg by mouth as needed for pain.  Modified Medications   No medications on file  Discontinued Medications   No medications on file

## 2013-09-02 ENCOUNTER — Ambulatory Visit: Payer: Self-pay | Admitting: Internal Medicine

## 2013-09-02 ENCOUNTER — Telehealth: Payer: Self-pay | Admitting: Family Medicine

## 2013-09-02 MED ORDER — FLUOXETINE HCL 10 MG PO TABS: 20.0000 mg | ORAL_TABLET | Freq: Every day | ORAL | Status: DC

## 2013-09-02 NOTE — Telephone Encounter (Signed)
Patient Information:  Caller Name: [place]  Phone: [phone]  Patient: Samantha Ramos, Samantha Ramos  Gender: Female  DOB: [birth year]  Age: 23 Years  PCP: Eliezer Lofts (Family Practice)  Pregnant: No  Office Follow Up:  Does the office need to follow up with this patient?: No  Instructions For The Office: N/A  RN Note:  Patient states she was seen in the office 08/25/13, by Dr. Lorelei Pont. Patient states she was prescribed Prozac 20mg . Patient states she was advised to start taking 1/2 tablet of Prozac X 1 week then progress to taking a whole tablet. Patient states the Prozac was dispensed in capsule form so she was unable to start by taking 1/2 dosage. Patient states she started taking Prozac 20mg . daily since 08/26/13. Patient states she developed a headache, onset 09/01/13. Patient describes headache as generalized headache and describes as "pressure." States pain varies from "4-10" on 1-10 scale. Patient states headache is currently at a "4" on a 1-10 scale. Afebrile. No nuccal rigidity. Patient states she had nausea 09/02/13 but denies nausea 09/02/13. No vomiting. Denies dizziness or visual disturbances. Patient states headache was unrelieved with Tylenol. Care advice given per guidelines. Call back parameters reviewed. Patient verbalizes understanding.  Symptoms  Reason For Call & Symptoms: Headache  Reviewed Health History In EMR: Yes  Reviewed Medications In EMR: Yes  Reviewed Allergies In EMR: Yes  Reviewed Surgeries / Procedures: Yes  Date of Onset of Symptoms: 09/01/2013  Treatments Tried: Tylenol  Treatments Tried Worked: No OB / GYN:  LMP: 08/05/2013  Guideline(s) Used:  Headache  Disposition Per Guideline:   See Today or Tomorrow in Office  Reason For Disposition Reached:   Unexplained headache that is present > 24 hours  Advice Given:  Pain Medicines:  For pain relief, you can take either acetaminophen, ibuprofen, or naproxen.  Pain Medicines:  For pain relief, you can take  either acetaminophen, ibuprofen, or naproxen.  Ibuprofen (e.g., Motrin, Advil):  Another choice is to take 600 mg (three 200 mg pills) by mouth every 8 hours.  Rest:   Lie down in a dark, quiet place and try to relax. Close your eyes and imagine your entire body relaxing.  Apply Cold to the Area:   Apply a cold wet washcloth or cold pack to the forehead for 20 minutes.  Call Back If:  You become worse.  Patient Will Follow Care Advice:  YES  Appointment Scheduled:  09/02/2013 14:15:00 Appointment Scheduled Provider:  Viviana Simpler [facility])

## 2013-09-02 NOTE — Telephone Encounter (Signed)
Left message for Jeanet that Dr. Diona Browner sent in Rx for Prozac 10 mg to try and see if she tolerates better.  She has appointment scheduled for Thursday with Dr. Diona Browner.  Advised to keep that appointment if she continues to have the headache.

## 2013-09-02 NOTE — Telephone Encounter (Signed)
Sent in rx for 10 mg daily for her to try to see if tolerated better. Make sure she has follow up in 1 month, she can call sooner if still having SE on low dose.

## 2013-09-04 ENCOUNTER — Ambulatory Visit: Payer: BC Managed Care – PPO | Admitting: Family Medicine

## 2013-10-07 ENCOUNTER — Ambulatory Visit: Payer: BC Managed Care – PPO | Admitting: Family Medicine

## 2013-10-07 DIAGNOSIS — Z0289 Encounter for other administrative examinations: Secondary | ICD-10-CM

## 2013-11-25 ENCOUNTER — Ambulatory Visit (INDEPENDENT_AMBULATORY_CARE_PROVIDER_SITE_OTHER): Payer: BC Managed Care – PPO | Admitting: Family Medicine

## 2013-11-25 ENCOUNTER — Encounter: Payer: Self-pay | Admitting: Family Medicine

## 2013-11-25 VITALS — BP 100/62 | HR 90 | Temp 98.2°F | Ht 69.0 in | Wt 265.2 lb

## 2013-11-25 DIAGNOSIS — H00015 Hordeolum externum left lower eyelid: Secondary | ICD-10-CM

## 2013-11-25 MED ORDER — ERYTHROMYCIN 5 MG/GM OP OINT: 1.0000 "application " | TOPICAL_OINTMENT | Freq: Every day | OPHTHALMIC | Status: AC

## 2013-11-25 NOTE — Patient Instructions (Addendum)
Apply erythromycin ointment to lower lid.  Treat with warm compresses 2-3 time a day. Hold eye make up on lower lid if able.  Call if not improving in 3 weeks for referral/re-eval.

## 2013-11-25 NOTE — Progress Notes (Signed)
   Subjective:    Patient ID: Samantha Ramos, female    DOB: 08/26/1990, 23 y.o.   MRN: [id number]      HPI 23 year old female pt presents with new onset lesion in  Left eye in the last 3 weeks. On bottom lid.  Lesion gets red and swollen off and on, mildly tender off and on. No vision issues. No cough, no congestion. Had this initially.   No fever.  She has tried to do warm compress, but has not done regularly.    Review of Systems  Constitutional: Negative for fatigue.  HENT: Negative for ear pain.   Eyes: Negative for redness.  Respiratory: Negative for chest tightness and shortness of breath.   Cardiovascular: Negative for chest pain.       Objective:   Physical Exam  Constitutional: Vital signs are normal. She appears well-developed and well-nourished. She is cooperative.  Non-toxic appearance. She does not appear ill. No distress.  HENT:  Head: Normocephalic.  Right Ear: Hearing, tympanic membrane, external ear and ear canal normal. Tympanic membrane is not erythematous, not retracted and not bulging.  Left Ear: Hearing, tympanic membrane, external ear and ear canal normal. Tympanic membrane is not erythematous, not retracted and not bulging.  Nose: Mucosal edema and rhinorrhea present. Right sinus exhibits no maxillary sinus tenderness and no frontal sinus tenderness. Left sinus exhibits no maxillary sinus tenderness and no frontal sinus tenderness.  Mouth/Throat: Uvula is midline, oropharynx is clear and moist and mucous membranes are normal.  Eyes: Conjunctivae and EOM are normal. Pupils are equal, round, and reactive to light. Lids are everted and swept, no foreign bodies found. Right eye exhibits no chemosis, no discharge, no exudate and no hordeolum. No foreign body present in the right eye. Left eye exhibits hordeolum. Left eye exhibits no chemosis, no discharge and no exudate. No foreign body present in the left eye.    Neck: Trachea normal and normal range of  motion. Neck supple. Carotid bruit is not present. No thyroid mass and no thyromegaly present.  Cardiovascular: Normal rate, regular rhythm, S1 normal, S2 normal, normal heart sounds, intact distal pulses and normal pulses.  Exam reveals no gallop and no friction rub.   No murmur heard. Pulmonary/Chest: Effort normal and breath sounds normal. No tachypnea. No respiratory distress. She has no decreased breath sounds. She has no wheezes. She has no rhonchi. She has no rales.  Neurological: She is alert.  Skin: Skin is warm, dry and intact. No rash noted.  Psychiatric: Her speech is normal and behavior is normal. Judgment normal. Her mood appears not anxious. Cognition and memory are normal. She does not exhibit a depressed mood.          Assessment & Plan:

## 2013-11-25 NOTE — Assessment & Plan Note (Signed)
Antibiotic ointment given length of time presents . Warm compress. Hold eye make up on lower lid.

## 2013-11-25 NOTE — Progress Notes (Signed)
Pre visit review using our clinic review tool, if applicable. No additional management support is needed unless otherwise documented below in the visit note. 

## 2013-12-12 ENCOUNTER — Telehealth: Payer: Self-pay

## 2013-12-12 ENCOUNTER — Ambulatory Visit (INDEPENDENT_AMBULATORY_CARE_PROVIDER_SITE_OTHER): Payer: BC Managed Care – PPO | Admitting: Emergency Medicine

## 2013-12-12 VITALS — BP 116/70 | HR 86 | Temp 97.7°F | Resp 16 | Ht 69.0 in | Wt 262.0 lb

## 2013-12-12 DIAGNOSIS — H66002 Acute suppurative otitis media without spontaneous rupture of ear drum, left ear: Secondary | ICD-10-CM

## 2013-12-12 DIAGNOSIS — J020 Streptococcal pharyngitis: Secondary | ICD-10-CM

## 2013-12-12 MED ORDER — CEFPROZIL 500 MG PO TABS: 500.0000 mg | ORAL_TABLET | Freq: Two times a day (BID) | ORAL | Status: DC

## 2013-12-12 MED ORDER — AMOXICILLIN 500 MG PO CAPS: 500.0000 mg | ORAL_CAPSULE | Freq: Three times a day (TID) | ORAL | Status: DC

## 2013-12-12 NOTE — Telephone Encounter (Signed)
Noted thanks. Agree.

## 2013-12-12 NOTE — Progress Notes (Signed)
[facility] [address], [place] [address] [phone]  Date:  12/12/2013   Name:  Samantha Ramos   DOB:  1990/10/14   MRN:  [id number]  PCP:  Eliezer Lofts, MD    Chief Complaint: Ear Pain and Sore Throat   History of Present Illness:  Samantha Ramos is a 23 y.o. very pleasant female patient who presents with the following:  Ill since Tuesday.  Supervisor has strep.  She has a sore throat.  No coryza.  Non productive cough No fever or chills No nausea or vomiting No rash No improvement with over the counter medications or other home remedies.  Denies other complaint or health concern today.   Patient Active Problem List   Diagnosis Date Noted  . Hordeolum externum of left lower eyelid 11/25/2013  . Viral upper respiratory infection 09/11/2012  . 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 URI (upper respiratory infection) 05/11/2011  . Abdominal pain 12/19/2010  . Chest pain 12/19/2010  . ANXIETY STATE, UNSPECIFIED 12/21/2009  . VISUAL CHANGES 12/21/2009  . VITAMIN D DEFICIENCY 03/19/2009  . ALLERGIC RHINITIS 05/06/2008  . VASOVAGAL SYNCOPE 05/06/2008  . OTHER ABNORMALITY OF URINATION 05/06/2008  . DYSPLASTIC NEVUS, BACK 06/13/2007  . ANXIETY, SITUATIONAL 05/10/2007  . NEOPLASM, SKIN, UNCERTAIN BEHAVIOR 62/26/3335  . OVERWEIGHT 02/14/2007  . DEPRESSION 02/14/2007  . COMMON MIGRAINE 02/14/2007    Past Medical History  Diagnosis Date  . Allergy   . Depression     Past Surgical History  Procedure Laterality Date  . Fracture surgery  2006    right wrist, [facility]     History  Substance Use Topics  . Smoking status: Former Research scientist (life sciences)  . Smokeless tobacco: Never Used  . Alcohol Use: 0.0 oz/week    0 Not specified per week     Comment: occ    Family History  Problem Relation Age of Onset  . Hypertension Mother   . Hyperlipidemia Mother   . Depression Mother   . Cholelithiasis Mother   . Thyroid disease Mother   . Diabetes Maternal Grandmother   .  Heart disease Maternal Grandmother     CVA  . Diabetes Paternal Grandmother   . Coronary artery disease Paternal Grandfather   . Cancer Cousin     Leukemia    Allergies  Allergen Reactions  . Penicillins     REACTION: Rash    Medication list has been reviewed and updated.  Current Outpatient Prescriptions on File Prior to Visit  Medication Sig Dispense Refill  . FLUoxetine (PROZAC) 10 MG tablet Take 2 tablets (20 mg total) by mouth daily. (Patient not taking: Reported on 12/12/2013) 30 tablet 5   No current facility-administered medications on file prior to visit.    Review of Systems:  As per HPI, otherwise negative.    Physical Examination: Filed Vitals:   12/12/13 1638  BP: 116/70  Pulse: 86  Temp: 97.7 F (36.5 C)  Resp: 16   Filed Vitals:   12/12/13 1638  Height: 5\' 9"  (1.753 m)  Weight: 262 lb (118.842 kg)   Body mass index is 38.67 kg/(m^2). Ideal Body Weight: Weight in (lb) to have BMI = 25: 168.9  GEN: WDWN, NAD, Non-toxic, A & O x 3 HEENT: Atraumatic, Normocephalic. Neck supple. No masses, No LAD. Oropharynx red and injected Ears and Nose: No external deformity.  Left otitis media CV: RRR, No M/G/R. No JVD. No thrill. No extra heart sounds. PULM: CTA B, no wheezes, crackles, rhonchi.  No retractions. No resp. distress. No accessory muscle use. ABD: S, NT, ND, +BS. No rebound. No HSM. EXTR: No c/c/e NEURO Normal gait.  PSYCH: Normally interactive. Conversant. Not depressed or anxious appearing.  Calm demeanor.    Assessment and Plan: Strep Otitis media left Amoxicillin  Signed,  Ellison Carwin, MD

## 2013-12-12 NOTE — Patient Instructions (Signed)

## 2013-12-12 NOTE — Telephone Encounter (Signed)
No fever. plz call - think this can be seen at urgent care - call pt and notify ok to be seen at urgent care, not ER. Would want more information/justification documented prior to sending this 23 yo to ER (routed to [facility] and PCP as fyi).

## 2013-12-12 NOTE — Telephone Encounter (Signed)
PLEASE NOTE: All timestamps contained within this report are represented as [place] Standard Time. CONFIDENTIALTY NOTICE: This fax transmission is intended only for the addressee. It contains information that is legally privileged, confidential or otherwise protected from use or disclosure. If you are not the intended recipient, you are strictly prohibited from reviewing, disclosing, copying using or disseminating any of this information or taking any action in reliance on or regarding this information. If you have received this fax in error, please notify us immediately by telephone so that we can arrange for its return to [place]. Phone: [phone], Toll-Free: [phone], Fax: [phone] Page: 1 of 2 Call Id: [id number] [place] Patient Name: Samantha Ramos Gender: Female DOB: [birth year] Age: 23 Y 1 M 2 D Return Phone Number: [phone] (Primary) Address[place] [address] Client [facility] - Client Client Site [place] - Day Physician Diona Browner, Amy Contact Type Call Call Type Triage / Clinical Relationship To Patient Self Return Phone Number [phone] (Primary) Chief Complaint Sore Throat Initial Comment Caller states she has sore throat and ear pain PreDisposition Call Doctor Nurse Assessment Nurse: Vallery Sa, RN, [facility] [place] Date/Time Eilene Ghazi Time): 12/12/2013 2:44:04 PM Confirm and document reason for call. If symptomatic, describe symptoms. ---caller states she developed a bilateral earache yesterday and a sore throat today. No Fever. Has the patient traveled out of the country within the last 30 days? ---No Does the patient require triage? ---Yes Related visit to physician within the last 2 weeks? ---No Does the PT have any chronic conditions? (i.e. diabetes, asthma, etc.) ---No Did the patient indicate they were  pregnant? ---No Guidelines Guideline Title Affirmed Question Affirmed Notes Nurse Date/Time (Eastern Time) Sore Throat Unable to open mouth completely Vallery Sa, RN, [facility] 12/12/2013 2:45:28 PM Disp. Time Eilene Ghazi Time) Disposition Final User 12/12/2013 2:49:04 PM Go to ED Now Yes Vallery Sa, RN, [facility] Understands: Yes Disagree/Comply: Comply Care Advice Given Per Guideline PLEASE NOTE: All timestamps contained within this report are represented as [place] Standard Time. CONFIDENTIALTY NOTICE: This fax transmission is intended only for the addressee. It contains information that is legally privileged, confidential or otherwise protected from use or disclosure. If you are not the intended recipient, you are strictly prohibited from reviewing, disclosing, copying using or disseminating any of this information or taking any action in reliance on or regarding this information. If you have received this fax in error, please notify us immediately by telephone so that we can arrange for its return to [place]. Phone: [phone], Toll-Free: [phone], Fax: [phone] Page: 2 of 2 Call Id: [phone] Care Advice Given Per Guideline GO TO ED NOW: You need to be seen in the Emergency Department. Go to the ER at ___________ [place] now. Drive carefully. PAIN OR FEVER MEDICINES: ACETAMINOPHEN (E.G., TYLENOL): WATER: Drink sips of water. CARE ADVICE given per Sore Throat (Adult) guideline. Referrals [facility] - ED

## 2013-12-12 NOTE — Telephone Encounter (Signed)
Patient wanted to let you know she went to [facility] [address] and not to the ER.

## 2014-03-29 ENCOUNTER — Emergency Department (HOSPITAL_COMMUNITY): Payer: BLUE CROSS/BLUE SHIELD

## 2014-03-29 ENCOUNTER — Emergency Department (HOSPITAL_COMMUNITY)
Admission: EM | Admit: 2014-03-29 | Discharge: 2014-03-29 | Disposition: A | Discharge status: Home / Self Care | Payer: BLUE CROSS/BLUE SHIELD | Attending: Emergency Medicine | Admitting: Emergency Medicine

## 2014-03-29 ENCOUNTER — Encounter (HOSPITAL_COMMUNITY): Payer: Self-pay | Admitting: Emergency Medicine

## 2014-03-29 DIAGNOSIS — S4991XA Unspecified injury of right shoulder and upper arm, initial encounter: Secondary | ICD-10-CM | POA: Diagnosis not present

## 2014-03-29 DIAGNOSIS — Y92410 Unspecified street and highway as the place of occurrence of the external cause: Secondary | ICD-10-CM | POA: Diagnosis not present

## 2014-03-29 DIAGNOSIS — M79601 Pain in right arm: Secondary | ICD-10-CM

## 2014-03-29 DIAGNOSIS — Z79899 Other long term (current) drug therapy: Secondary | ICD-10-CM | POA: Diagnosis not present

## 2014-03-29 DIAGNOSIS — Z792 Long term (current) use of antibiotics: Secondary | ICD-10-CM | POA: Diagnosis not present

## 2014-03-29 DIAGNOSIS — S3992XA Unspecified injury of lower back, initial encounter: Secondary | ICD-10-CM | POA: Diagnosis present

## 2014-03-29 DIAGNOSIS — Z3202 Encounter for pregnancy test, result negative: Secondary | ICD-10-CM | POA: Insufficient documentation

## 2014-03-29 DIAGNOSIS — Y9389 Activity, other specified: Secondary | ICD-10-CM | POA: Diagnosis not present

## 2014-03-29 DIAGNOSIS — S39012A Strain of muscle, fascia and tendon of lower back, initial encounter: Secondary | ICD-10-CM | POA: Insufficient documentation

## 2014-03-29 DIAGNOSIS — F329 Major depressive disorder, single episode, unspecified: Secondary | ICD-10-CM | POA: Insufficient documentation

## 2014-03-29 DIAGNOSIS — Y998 Other external cause status: Secondary | ICD-10-CM | POA: Diagnosis not present

## 2014-03-29 DIAGNOSIS — Z880 Allergy status to penicillin: Secondary | ICD-10-CM | POA: Insufficient documentation

## 2014-03-29 DIAGNOSIS — M549 Dorsalgia, unspecified: Secondary | ICD-10-CM

## 2014-03-29 LAB — POC URINE PREG, ED: PREG TEST UR: NEGATIVE

## 2014-03-29 LAB — I-STAT CHEM 8, ED
BUN: 8 mg/dL (ref 6–23)
CALCIUM ION: 1.27 mmol/L — AB (ref 1.12–1.23)
Chloride: 100 mmol/L (ref 96–112)
Creatinine, Ser: 0.8 mg/dL (ref 0.50–1.10)
GLUCOSE: 84 mg/dL (ref 70–99)
HEMATOCRIT: 47 % — AB (ref 36.0–46.0)
HEMOGLOBIN: 16 g/dL — AB (ref 12.0–15.0)
Potassium: 4 mmol/L (ref 3.5–5.1)
Sodium: 140 mmol/L (ref 135–145)
TCO2: 23 mmol/L (ref 0–100)

## 2014-03-29 MED ORDER — IOHEXOL 300 MG/ML  SOLN
100.0000 mL | Freq: Once | INTRAMUSCULAR | Status: AC | PRN
  Administered 2014-03-29: 100 mL via INTRAVENOUS

## 2014-03-29 NOTE — ED Notes (Signed)
Pt currently exray

## 2014-03-29 NOTE — ED Notes (Signed)
Patient transported to CT 
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
## 2014-03-29 NOTE — Discharge Instructions (Signed)
Please call your doctor for a followup appointment within 24-48 hours. When you talk to your doctor please let them know that you were seen in the emergency department and have them acquire all of your records so that they can discuss the findings with you and formulate a treatment plan to fully care for your new and ongoing problems. Please call and set-up an appointment with your primary care provider Please rest and stay hydrated Please avoid any physical or strenuous activity  Please apply heat and massage with icy hot ointment Please continue to monitor symptoms closely and if symptoms are to worsen or change (fever greater than 101, chills, sweating, nausea, vomiting, chest pain, shortness of breathe, difficulty breathing, weakness, numbness, tingling, worsening or changes to pain pattern, fall, injury, dizziness, headache, loss of sensation, inability to control urine or bowel movements, inability keep food or fluids down) please report back to the Emergency Department immediately.   Lumbosacral Strain Lumbosacral strain is a strain of any of the parts that make up your lumbosacral vertebrae. Your lumbosacral vertebrae are the bones that make up the lower third of your backbone. Your lumbosacral vertebrae are held together by muscles and tough, fibrous tissue (ligaments).  CAUSES  A sudden blow to your back can cause lumbosacral strain. Also, anything that causes an excessive stretch of the muscles in the low back can cause this strain. This is typically seen when people exert themselves strenuously, fall, lift heavy objects, bend, or crouch repeatedly. RISK FACTORS  Physically demanding work.  Participation in pushing or pulling sports or sports that require a sudden twist of the back (tennis, golf, baseball).  Weight lifting.  Excessive lower back curvature.  Forward-tilted pelvis.  Weak back or abdominal muscles or both.  Tight hamstrings. SIGNS AND SYMPTOMS  Lumbosacral strain  may cause pain in the area of your injury or pain that moves (radiates) down your leg.  DIAGNOSIS Your health care provider can often diagnose lumbosacral strain through a physical exam. In some cases, you may need tests such as X-ray exams.  TREATMENT  Treatment for your lower back injury depends on many factors that your clinician will have to evaluate. However, most treatment will include the use of anti-inflammatory medicines. HOME CARE INSTRUCTIONS   Avoid hard physical activities (tennis, racquetball, waterskiing) if you are not in proper physical condition for it. This may aggravate or create problems.  If you have a back problem, avoid sports requiring sudden body movements. Swimming and walking are generally safer activities.  Maintain good posture.  Maintain a healthy weight.  For acute conditions, you may put ice on the injured area.  Put ice in a plastic bag.  Place a towel between your skin and the bag.  Leave the ice on for 20 minutes, 2-3 times a day.  When the low back starts healing, stretching and strengthening exercises may be recommended. SEEK MEDICAL CARE IF:  Your back pain is getting worse.  You experience severe back pain not relieved with medicines. SEEK IMMEDIATE MEDICAL CARE IF:   You have numbness, tingling, weakness, or problems with the use of your arms or legs.  There is a change in bowel or bladder control.  You have increasing pain in any area of the body, including your belly (abdomen).  You notice shortness of breath, dizziness, or feel faint.  You feel sick to your stomach (nauseous), are throwing up (vomiting), or become sweaty.  You notice discoloration of your toes or legs, or your feet  get very cold. MAKE SURE YOU:   Understand these instructions.  Will watch your condition.  Will get help right away if you are not doing well or get worse. Document Released: 10/05/2004 Document Revised: 12/31/2012 Document Reviewed:  08/14/2012 [facility] Patient Information 2015 [place], [place]. This information is not intended to replace advice given to you by your health care provider. Make sure you discuss any questions you have with your health care provider.
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
## 2014-03-29 NOTE — ED Notes (Signed)
Samantha Ramos remains in radiology

## 2014-04-16 ENCOUNTER — Ambulatory Visit: Payer: Self-pay | Admitting: Family Medicine

## 2014-04-17 ENCOUNTER — Ambulatory Visit (INDEPENDENT_AMBULATORY_CARE_PROVIDER_SITE_OTHER): Payer: BLUE CROSS/BLUE SHIELD | Admitting: Primary Care

## 2014-04-17 ENCOUNTER — Encounter: Payer: Self-pay | Admitting: Primary Care

## 2014-04-17 VITALS — BP 118/64 | HR 81 | Temp 98.0°F | Ht 67.5 in | Wt 269.0 lb

## 2014-04-17 DIAGNOSIS — M549 Dorsalgia, unspecified: Secondary | ICD-10-CM | POA: Insufficient documentation

## 2014-04-17 MED ORDER — CYCLOBENZAPRINE HCL 5 MG PO TABS: 5.0000 mg | ORAL_TABLET | Freq: Three times a day (TID) | ORAL | Status: DC | PRN

## 2014-04-17 NOTE — Patient Instructions (Signed)
You may take Flexeril (muscle relaxer) three times daily as needed for muscle spasms. Start by taking it at bedtime because it will make you drowsy. You may try taking ibuprofen 400-600 mg as needed for pain. Do not exceed more than 2000 mg daily. Continue using your heating pad as needed, do not sleep on it. Follow up in 2 weeks for re-evaluation. I hope you feel better soon!  Back Pain, Adult Low back pain is very common. About 1 in 5 people have back pain.The cause of low back pain is rarely dangerous. The pain often gets better over time.About half of people with a sudden onset of back pain feel better in just 2 weeks. About 8 in 10 people feel better by 6 weeks.  CAUSES Some common causes of back pain include:  Strain of the muscles or ligaments supporting the spine.  Wear and tear (degeneration) of the spinal discs.  Arthritis.  Direct injury to the back. DIAGNOSIS Most of the time, the direct cause of low back pain is not known.However, back pain can be treated effectively even when the exact cause of the pain is unknown.Answering your caregiver's questions about your overall health and symptoms is one of the most accurate ways to make sure the cause of your pain is not dangerous. If your caregiver needs more information, he or she may order lab work or imaging tests (X-rays or MRIs).However, even if imaging tests show changes in your back, this usually does not require surgery. HOME CARE INSTRUCTIONS For many people, back pain returns.Since low back pain is rarely dangerous, it is often a condition that people can learn to [facility] their own.   Remain active. It is stressful on the back to sit or stand in one place. Do not sit, drive, or stand in one place for more than 30 minutes at a time. Take short walks on level surfaces as soon as pain allows.Try to increase the length of time you walk each day.  Do not stay in bed.Resting more than 1 or 2 days can delay your  recovery.  Do not avoid exercise or work.Your body is made to move.It is not dangerous to be active, even though your back may hurt.Your back will likely heal faster if you return to being active before your pain is gone.  Pay attention to your body when you bend and lift. Many people have less discomfortwhen lifting if they bend their knees, keep the load close to their bodies,and avoid twisting. Often, the most comfortable positions are those that put less stress on your recovering back.  Find a comfortable position to sleep. Use a firm mattress and lie on your side with your knees slightly bent. If you lie on your back, put a pillow under your knees.  Only take over-the-counter or prescription medicines as directed by your caregiver. Over-the-counter medicines to reduce pain and inflammation are often the most helpful.Your caregiver may prescribe muscle relaxant drugs.These medicines help dull your pain so you can more quickly return to your normal activities and healthy exercise.  Put ice on the injured area.  Put ice in a plastic bag.  Place a towel between your skin and the bag.  Leave the ice on for 15-20 minutes, 03-04 times a day for the first 2 to 3 days. After that, ice and heat may be alternated to reduce pain and spasms.  Ask your caregiver about trying back exercises and gentle massage. This may be of some benefit.  Avoid feeling  anxious or stressed.Stress increases muscle tension and can worsen back pain.It is important to recognize when you are anxious or stressed and learn ways to manage it.Exercise is a great option. SEEK MEDICAL CARE IF:  You have pain that is not relieved with rest or medicine.  You have pain that does not improve in 1 week.  You have new symptoms.  You are generally not feeling well. SEEK IMMEDIATE MEDICAL CARE IF:   You have pain that radiates from your back into your legs.  You develop new bowel or bladder control problems.  You  have unusual weakness or numbness in your arms or legs.  You develop nausea or vomiting.  You develop abdominal pain.  You feel faint. Document Released: 12/26/2004 Document Revised: 06/27/2011 Document Reviewed: 04/29/2013 [facility] Patient Information 2015 [place], [place]. This information is not intended to replace advice given to you by your health care provider. Make sure you discuss any questions you have with your health care provider.
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
## 2014-05-08 ENCOUNTER — Encounter: Payer: Self-pay | Admitting: Primary Care

## 2014-05-08 ENCOUNTER — Ambulatory Visit (INDEPENDENT_AMBULATORY_CARE_PROVIDER_SITE_OTHER): Payer: BLUE CROSS/BLUE SHIELD | Admitting: Primary Care

## 2014-05-08 VITALS — BP 116/74 | HR 72 | Temp 98.0°F | Ht 67.5 in | Wt 267.0 lb

## 2014-05-08 DIAGNOSIS — M549 Dorsalgia, unspecified: Secondary | ICD-10-CM | POA: Diagnosis not present

## 2014-05-08 DIAGNOSIS — M545 Low back pain, unspecified: Secondary | ICD-10-CM

## 2014-05-08 MED ORDER — TIZANIDINE HCL 2 MG PO TABS: 2.0000 mg | ORAL_TABLET | Freq: Every evening | ORAL | Status: DC | PRN

## 2014-05-08 MED ORDER — PREDNISONE 20 MG PO TABS: ORAL_TABLET | ORAL | Status: DC
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
## 2014-05-08 NOTE — Progress Notes (Signed)
Pre visit review using our clinic review tool, if applicable. No additional management support is needed unless otherwise documented below in the visit note. 

## 2014-05-08 NOTE — Patient Instructions (Addendum)
Stop taking Flexeril and start Tizanidine at bedtime as needed for muscle spasms.  Start Prednisone tablets. Take 2 tablets by mouth daily for 5 days. You may continue to alternate ibuprofen and tylenol. You will be contacted regarding your referral to Physical Therapy.  Please let us know if you have not heard back within one week.  Follow up with either myself or Dr. Diona Browner in one month for re-evaluation. It was great to see you, let me know if you need anything!  Back Pain, Adult Low back pain is very common. About 1 in 5 people have back pain.The cause of low back pain is rarely dangerous. The pain often gets better over time.About half of people with a sudden onset of back pain feel better in just 2 weeks. About 8 in 10 people feel better by 6 weeks.  CAUSES Some common causes of back pain include:  Strain of the muscles or ligaments supporting the spine.  Wear and tear (degeneration) of the spinal discs.  Arthritis.  Direct injury to the back. DIAGNOSIS Most of the time, the direct cause of low back pain is not known.However, back pain can be treated effectively even when the exact cause of the pain is unknown.Answering your caregiver's questions about your overall health and symptoms is one of the most accurate ways to make sure the cause of your pain is not dangerous. If your caregiver needs more information, he or she may order lab work or imaging tests (X-rays or MRIs).However, even if imaging tests show changes in your back, this usually does not require surgery. HOME CARE INSTRUCTIONS For many people, back pain returns.Since low back pain is rarely dangerous, it is often a condition that people can learn to [facility] their own.   Remain active. It is stressful on the back to sit or stand in one place. Do not sit, drive, or stand in one place for more than 30 minutes at a time. Take short walks on level surfaces as soon as pain allows.Try to increase the length of time you  walk each day.  Do not stay in bed.Resting more than 1 or 2 days can delay your recovery.  Do not avoid exercise or work.Your body is made to move.It is not dangerous to be active, even though your back may hurt.Your back will likely heal faster if you return to being active before your pain is gone.  Pay attention to your body when you bend and lift. Many people have less discomfortwhen lifting if they bend their knees, keep the load close to their bodies,and avoid twisting. Often, the most comfortable positions are those that put less stress on your recovering back.  Find a comfortable position to sleep. Use a firm mattress and lie on your side with your knees slightly bent. If you lie on your back, put a pillow under your knees.  Only take over-the-counter or prescription medicines as directed by your caregiver. Over-the-counter medicines to reduce pain and inflammation are often the most helpful.Your caregiver may prescribe muscle relaxant drugs.These medicines help dull your pain so you can more quickly return to your normal activities and healthy exercise.  Put ice on the injured area.  Put ice in a plastic bag.  Place a towel between your skin and the bag.  Leave the ice on for 15-20 minutes, 03-04 times a day for the first 2 to 3 days. After that, ice and heat may be alternated to reduce pain and spasms.  Ask your caregiver about  trying back exercises and gentle massage. This may be of some benefit.  Avoid feeling anxious or stressed.Stress increases muscle tension and can worsen back pain.It is important to recognize when you are anxious or stressed and learn ways to manage it.Exercise is a great option. SEEK MEDICAL CARE IF:  You have pain that is not relieved with rest or medicine.  You have pain that does not improve in 1 week.  You have new symptoms.  You are generally not feeling well. SEEK IMMEDIATE MEDICAL CARE IF:   You have pain that radiates from your  back into your legs.  You develop new bowel or bladder control problems.  You have unusual weakness or numbness in your arms or legs.  You develop nausea or vomiting.  You develop abdominal pain.  You feel faint. Document Released: 12/26/2004 Document Revised: 06/27/2011 Document Reviewed: 04/29/2013 [facility] Patient Information 2015 [place], [place]. This information is not intended to replace advice given to you by your health care provider. Make sure you discuss any questions you have with your health care provider.
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
## 2014-10-12 ENCOUNTER — Ambulatory Visit: Payer: BLUE CROSS/BLUE SHIELD | Admitting: Primary Care

## 2014-10-15 ENCOUNTER — Ambulatory Visit (INDEPENDENT_AMBULATORY_CARE_PROVIDER_SITE_OTHER): Payer: BLUE CROSS/BLUE SHIELD | Admitting: Primary Care

## 2014-10-15 ENCOUNTER — Encounter: Payer: Self-pay | Admitting: Primary Care

## 2014-10-15 VITALS — BP 122/68 | HR 76 | Temp 97.6°F | Ht 68.0 in | Wt 268.1 lb

## 2014-10-15 DIAGNOSIS — M545 Low back pain, unspecified: Secondary | ICD-10-CM

## 2014-10-15 NOTE — Progress Notes (Signed)
Pre visit review using our clinic review tool, if applicable. No additional management support is needed unless otherwise documented below in the visit note. 
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
## 2014-10-21 ENCOUNTER — Encounter: Payer: Self-pay | Admitting: Primary Care

## 2014-10-21 ENCOUNTER — Ambulatory Visit (INDEPENDENT_AMBULATORY_CARE_PROVIDER_SITE_OTHER): Payer: BLUE CROSS/BLUE SHIELD | Admitting: Primary Care

## 2014-10-21 VITALS — BP 116/70 | HR 78 | Temp 97.3°F | Ht 68.0 in | Wt 266.8 lb

## 2014-10-21 DIAGNOSIS — J309 Allergic rhinitis, unspecified: Secondary | ICD-10-CM | POA: Diagnosis not present

## 2014-10-21 NOTE — Patient Instructions (Signed)
Your symptoms are likely related to allergies.  For nasal congestion: Try fluticasone (Flonase) nasal spray. Instill 2 sprays in each nostril once daily.  For drainage, runny nose, sneezing: Try taking Claritin, Zyrtec, or Allegra daily for 2 weeks.  For cough: Try taking Delsym.   Increase consumption of fluids and rest.  Please call me on Monday if no improvement.  It was a pleasure to see you today!

## 2014-10-21 NOTE — Progress Notes (Signed)
Pre visit review using our clinic review tool, if applicable. No additional management support is needed unless otherwise documented below in the visit note. 

## 2014-10-21 NOTE — Progress Notes (Signed)
Subjective:    Patient ID: Samantha Ramos, female    DOB: 09-19-1990, 24 y.o.   MRN: [id number]  HPI  [name] is a 24 year old female who presents today with a chief complaint of cough. She also reports symptoms of sore throat, rhinorrhea, sinus pressure and nasal congestion. She denies fevers, body aches. Her symptoms have been present since Sunday night this week. She's taken some amoxicillin that she had from a prior infection for the past 2 days, dayquil, and advil allergy tablets. Overall she's not had much relief.   Review of Systems  Constitutional: Negative for fever.  HENT: Positive for congestion, ear pain, postnasal drip, rhinorrhea, sinus pressure, sneezing and sore throat.   Respiratory: Positive for cough. Negative for shortness of breath.   Cardiovascular: Negative for chest pain.  Musculoskeletal: Negative for myalgias.  Neurological: Negative for headaches.       Past Medical History  Diagnosis Date  . Allergy   . Depression     Social History   Social History  . Marital Status: Single    Spouse Name: N/A  . Number of Children: N/A  . Years of Education: N/A   Occupational History  . Not on file.   Social History Main Topics  . Smoking status: Never Smoker   . Smokeless tobacco: Never Used  . Alcohol Use: 0.0 oz/week    0 Standard drinks or equivalent per week     Comment: occ  . Drug Use: No  . Sexual Activity: Not on file   Other Topics Concern  . 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 Dental surgery      Family History  Problem Relation Age of Onset  . Hypertension Mother   . Hyperlipidemia Mother   . Depression Mother   . Cholelithiasis Mother   .  Thyroid disease Mother   . Diabetes Maternal Grandmother   . Heart disease Maternal Grandmother     CVA  . Diabetes Paternal Grandmother   . Coronary artery disease Paternal Grandfather   . Cancer Cousin     Leukemia  . Hypertension Father   . Diabetes Father     Allergies  Allergen Reactions  . Penicillins     REACTION: Rash    No current outpatient prescriptions on file prior to visit.   No current facility-administered medications on file prior to visit.    BP 116/70 mmHg  Pulse 78  Temp(Src) 97.3 F (36.3 C) (Oral)  Ht 5\' 8"  (1.727 m)  Wt 266 lb 12.8 oz (121.02 kg)  BMI 40.58 kg/m2  SpO2 98%  LMP 09/24/2014    Objective:   Physical Exam  Constitutional: She appears well-nourished.  HENT:  Right Ear: Tympanic membrane and ear canal normal.  Left Ear: Ear canal normal. Tympanic membrane is bulging. Tympanic membrane is not erythematous.  Nose: Right sinus exhibits no maxillary sinus tenderness and no frontal sinus tenderness. Left sinus exhibits no maxillary sinus tenderness and no frontal sinus tenderness.  Mouth/Throat: Oropharynx is clear and moist.  Eyes: Conjunctivae are normal. Pupils are equal, round, and reactive to light.  Neck: Neck supple.  Cardiovascular: Normal rate  and regular rhythm.   Pulmonary/Chest: Effort normal and breath sounds normal.  Lymphadenopathy:    She has no cervical adenopathy.  Skin: Skin is warm and dry.          Assessment & Plan:  Allergic Rhinitis:  Cough, rhinorrhea, sinus pressure, sneezing x 4 days. Not currently taking antihistamine or nasal spray. Exam with cobblestone feature to posterior pharynx and bulging without erythema to left TM. Treat with supportive measures: Claritin, flonase, Delsym, fluids, rest.  Follow up PRN.

## 2015-02-01 ENCOUNTER — Other Ambulatory Visit: Payer: Self-pay | Admitting: Primary Care

## 2015-02-01 DIAGNOSIS — Z0000 Encounter for general adult medical examination without abnormal findings: Secondary | ICD-10-CM

## 2015-02-01 DIAGNOSIS — E559 Vitamin D deficiency, unspecified: Secondary | ICD-10-CM

## 2015-02-10 ENCOUNTER — Other Ambulatory Visit: Payer: BLUE CROSS/BLUE SHIELD

## 2015-02-15 ENCOUNTER — Encounter: Payer: PRIVATE HEALTH INSURANCE | Admitting: Primary Care

## 2015-02-16 ENCOUNTER — Encounter: Payer: Self-pay | Admitting: Primary Care

## 2015-02-16 ENCOUNTER — Ambulatory Visit (INDEPENDENT_AMBULATORY_CARE_PROVIDER_SITE_OTHER): Payer: BLUE CROSS/BLUE SHIELD | Admitting: Primary Care

## 2015-02-16 DIAGNOSIS — Z0000 Encounter for general adult medical examination without abnormal findings: Secondary | ICD-10-CM | POA: Diagnosis not present

## 2015-02-16 DIAGNOSIS — E559 Vitamin D deficiency, unspecified: Secondary | ICD-10-CM

## 2015-02-16 DIAGNOSIS — Z0001 Encounter for general adult medical examination with abnormal findings: Secondary | ICD-10-CM | POA: Insufficient documentation

## 2015-02-16 LAB — CBC
HCT: 42.2 % (ref 36.0–46.0)
Hemoglobin: 13.7 g/dL (ref 12.0–15.0)
MCHC: 32.6 g/dL (ref 30.0–36.0)
MCV: 82.3 fl (ref 78.0–100.0)
PLATELETS: 283 10*3/uL (ref 150.0–400.0)
RBC: 5.12 Mil/uL — ABNORMAL HIGH (ref 3.87–5.11)
RDW: 14.3 % (ref 11.5–15.5)
WBC: 8.7 10*3/uL (ref 4.0–10.5)

## 2015-02-16 LAB — COMPREHENSIVE METABOLIC PANEL
ALK PHOS: 58 U/L (ref 39–117)
ALT: 9 U/L (ref 0–35)
AST: 15 U/L (ref 0–37)
Albumin: 4 g/dL (ref 3.5–5.2)
BILIRUBIN TOTAL: 0.8 mg/dL (ref 0.2–1.2)
BUN: 10 mg/dL (ref 6–23)
CO2: 31 mEq/L (ref 19–32)
Calcium: 9.3 mg/dL (ref 8.4–10.5)
Chloride: 104 mEq/L (ref 96–112)
Creatinine, Ser: 0.73 mg/dL (ref 0.40–1.20)
GFR: 105.7 mL/min (ref >60.00)
GLUCOSE: 70 mg/dL (ref 70–99)
Potassium: 4.1 mEq/L (ref 3.5–5.1)
Sodium: 139 mEq/L (ref 135–145)
TOTAL PROTEIN: 6.7 g/dL (ref 6.0–8.3)

## 2015-02-16 LAB — TSH: TSH: 1.66 u[IU]/mL (ref 0.35–4.50)

## 2015-02-16 LAB — HEMOGLOBIN A1C: Hgb A1c MFr Bld: 5.8 % (ref 4.6–6.5)

## 2015-02-16 LAB — LIPID PANEL
CHOL/HDL RATIO: 4
Cholesterol: 127 mg/dL (ref 0–200)
HDL: 34 mg/dL — ABNORMAL LOW (ref >39.00)
LDL CALC: 75 mg/dL (ref 0–99)
NonHDL: 92.76
TRIGLYCERIDES: 90 mg/dL (ref 0.0–149.0)
VLDL: 18 mg/dL (ref 0.0–40.0)

## 2015-02-16 LAB — VITAMIN D 25 HYDROXY (VIT D DEFICIENCY, FRACTURES): VITD: 16.13 ng/mL — ABNORMAL LOW (ref 30.00–100.00)

## 2015-02-16 NOTE — Assessment & Plan Note (Signed)
Tdap UTD. Declines Flu. Scheduled for pap with GYN next week. Poor diet and does not exercise. Discussed the importance of a healthy diet and regular exercise in order for weight loss and to reduce risk of other medical diseases.  Exam unremarkable. Labs pending. Follow up in 1 year for repeat physical.

## 2015-02-16 NOTE — Patient Instructions (Signed)
Complete lab work prior to leaving today. I will notify you of your results once received.   It is important that you improve your diet. Please limit carbohydrates in the form of white bread, rice, pasta, fast food, fried food, sugary drinks, etc. Increase your consumption of fresh fruits and vegetables.  You need to consume about 2 liters of water daily.  Start exercising. You should be getting 1 hour of moderate intensity exercise 5 days weekly.  Follow up in 1 year for repeat physical or sooner if needed.  It was a pleasure to see you today!

## 2015-02-16 NOTE — Addendum Note (Signed)
Addended by: Marchia Bond on: 02/16/2015 04:07 PM   Modules accepted: Miquel Dunn

## 2015-02-16 NOTE — Progress Notes (Signed)
Subjective:    Patient ID: Samantha Ramos, female    DOB: 29-Jun-1990, 25 y.o.   MRN: [id number]  HPI  Ms. Sprouse is a 25 year old female who presents today for complete physical.  Immunizations: -Tetanus: Completed in 08/2011 -Influenza: Declines   Diet: She endorses a fair diet. Breakfast: Oatmeal Lunch: Sandwich, fast food. Dinner: Skips, salad, fast food Snacks: None Desserts: None Beverages: Little water, sweet tea, sodas  Exercise: She does currently exercise. Eye exam: Completed in 2016 Dental exam: Due Pap Smear: Scheduled for next week.    Review of Systems  Constitutional: Negative for unexpected weight change.  HENT: Negative for rhinorrhea.   Respiratory: Negative for cough and shortness of breath.   Cardiovascular: Negative for chest pain.  Gastrointestinal: Negative for diarrhea and constipation.  Genitourinary: Negative for difficulty urinating.       Irregular periods  Musculoskeletal:       Heel spurs to bilateral heels, currently treated by podiatry.  Allergic/Immunologic: Negative for environmental allergies.  Neurological: Negative for dizziness, numbness and headaches.  Psychiatric/Behavioral:       Denies concerns for anxiety or depression       Past Medical History  Diagnosis Date  . Allergy   . Depression     Social History   Social History  . Marital Status: Single    Spouse Name: N/A  . Number of Children: N/A  . Years of Education: N/A   Occupational History  . Not on file.   Social History Main Topics  . Smoking status: Never Smoker   . Smokeless tobacco: Never Used  . Alcohol Use: 0.0 oz/week    0 Standard drinks or equivalent per week     Comment: occ  . Drug Use: No  . Sexual Activity: Not on file   Other Topics Concern  . Not on file   Social History Narrative   Grades A's and B's   Culinary Arts or Engineer, manufacturing and Swim team   Diet: irregular eating, gatorade and chips at lunch, some fruits,  and occ veggies   Parents divorced when he was 62, got some counseling then    Past Surgical History  Procedure Laterality Date  . Dental surgery      Family History  Problem Relation Age of Onset  . Hypertension Mother   . Hyperlipidemia Mother   . Depression Mother   . Cholelithiasis Mother   . Thyroid disease Mother   . Diabetes Maternal Grandmother   . Heart disease Maternal Grandmother     CVA  . Diabetes Paternal Grandmother   . Coronary artery disease Paternal Grandfather   . Cancer Cousin     Leukemia  . Hypertension Father   . Diabetes Father     Allergies  Allergen Reactions  . Penicillins     REACTION: Rash    No current outpatient prescriptions on file prior to visit.   No current facility-administered medications on file prior to visit.    BP 114/70 mmHg  Pulse 72  Temp(Src) 97.6 F (36.4 C) (Oral)  Ht 5\' 8"  (1.727 m)  Wt 271 lb 1.9 oz (122.979 kg)  BMI 41.23 kg/m2  SpO2 98%  LMP 12/16/2014     Objective:   Physical Exam  Constitutional: She is oriented to person, place, and time. She appears well-nourished.  HENT:  Right Ear: Tympanic membrane and ear canal normal.  Left Ear: Tympanic membrane and ear canal normal.  Nose: Nose normal.  Mouth/Throat: Oropharynx is clear and moist.  Eyes: Conjunctivae and EOM are normal. Pupils are equal, round, and reactive to light.  Neck: Neck supple. No thyromegaly present.  Cardiovascular: Normal rate and regular rhythm.   No murmur heard. Pulmonary/Chest: Effort normal and breath sounds normal. She has no rales.  Abdominal: Soft. Bowel sounds are normal. There is no tenderness.  Musculoskeletal: Normal range of motion.  Lymphadenopathy:    She has no cervical adenopathy.  Neurological: She is alert and oriented to person, place, and time. She has normal reflexes. No cranial nerve deficit.  Skin: Skin is warm and dry. No rash noted.  Psychiatric: She has a normal mood and affect.            Assessment & Plan:

## 2015-02-17 ENCOUNTER — Encounter: Payer: Self-pay | Admitting: *Deleted

## 2015-05-10 ENCOUNTER — Other Ambulatory Visit: Payer: Self-pay | Admitting: Primary Care

## 2015-05-10 DIAGNOSIS — R7309 Other abnormal glucose: Secondary | ICD-10-CM

## 2015-05-17 ENCOUNTER — Other Ambulatory Visit: Payer: BLUE CROSS/BLUE SHIELD

## 2015-05-24 ENCOUNTER — Other Ambulatory Visit: Payer: BLUE CROSS/BLUE SHIELD

## 2015-06-21 ENCOUNTER — Ambulatory Visit (INDEPENDENT_AMBULATORY_CARE_PROVIDER_SITE_OTHER): Payer: BLUE CROSS/BLUE SHIELD | Admitting: Family

## 2015-06-21 ENCOUNTER — Encounter: Payer: Self-pay | Admitting: Family

## 2015-06-21 VITALS — BP 120/70 | HR 85 | Temp 98.0°F | Ht 68.0 in | Wt 273.0 lb

## 2015-06-21 DIAGNOSIS — F329 Major depressive disorder, single episode, unspecified: Secondary | ICD-10-CM

## 2015-06-21 DIAGNOSIS — F418 Other specified anxiety disorders: Secondary | ICD-10-CM

## 2015-06-21 DIAGNOSIS — F419 Anxiety disorder, unspecified: Principal | ICD-10-CM

## 2015-06-21 MED ORDER — FLUOXETINE HCL 20 MG PO TABS: 20.0000 mg | ORAL_TABLET | Freq: Every day | ORAL | Status: DC

## 2015-06-21 NOTE — Patient Instructions (Signed)
Our hope is for gradual improvement of mood since starting medication; however this may take several weeks.   If you start to have unusual thoughts, thoughts of hurting yourself, or anyone else, please go immediately to the emergency department.   Follow up in one month.    National Suicide Prevention Hotline - available 24 hours a day, 7 days a week.  ([phone]  Major Depressive Disorder Major depressive disorder is a mental illness. It also may be called clinical depression or unipolar depression. Major depressive disorder usually causes feelings of sadness, hopelessness, or helplessness. Some people with this disorder do not feel particularly sad but lose interest in doing things they used to enjoy (anhedonia). Major depressive disorder also can cause physical symptoms. It can interfere with work, school, relationships, and other normal everyday activities. The disorder varies in severity but is longer lasting and more serious than the sadness we all feel from time to time in our lives. Major depressive disorder often is triggered by stressful life events or major life changes. Examples of these triggers include divorce, loss of your job or home, a move, and the death of a family member or close friend. Sometimes this disorder occurs for no obvious reason at all. People who have family members with major depressive disorder or bipolar disorder are at higher risk for developing this disorder, with or without life stressors. Major depressive disorder can occur at any age. It may occur just once in your life (single episode major depressive disorder). It may occur multiple times (recurrent major depressive disorder). SYMPTOMS People with major depressive disorder have either anhedonia or depressed mood on nearly a daily basis for at least 2 weeks or longer. Symptoms of depressed mood include:  Feelings of sadness (blue or down in the dumps) or emptiness.  Feelings of hopelessness or  helplessness.  Tearfulness or episodes of crying (may be observed by others).  Irritability (children and adolescents). In addition to depressed mood or anhedonia or both, people with this disorder have at least four of the following symptoms:  Difficulty sleeping or sleeping too much.   Significant change (increase or decrease) in appetite or weight.   Lack of energy or motivation.  Feelings of guilt and worthlessness.   Difficulty concentrating, remembering, or making decisions.  Unusually slow movement (psychomotor retardation) or restlessness (as observed by others).   Recurrent wishes for death, recurrent thoughts of self-harm (suicide), or a suicide attempt. People with major depressive disorder commonly have persistent negative thoughts about themselves, other people, and the world. People with severe major depressive disorder may experiencedistorted beliefs or perceptions about the world (psychotic delusions). They also may see or hear things that are not real (psychotic hallucinations). DIAGNOSIS Major depressive disorder is diagnosed through an assessment by your health care provider. Your health care provider will ask aboutaspects of your daily life, such as mood,sleep, and appetite, to see if you have the diagnostic symptoms of major depressive disorder. Your health care provider may ask about your medical history and use of alcohol or drugs, including prescription medicines. Your health care provider also may do a physical exam and blood work. This is because certain medical conditions and the use of certain substances can cause major depressive disorder-like symptoms (secondary depression). Your health care provider also may refer you to a mental health specialist for further evaluation and treatment. TREATMENT It is important to recognize the symptoms of major depressive disorder and seek treatment. The following treatments can be prescribed for this disorder:  Medicine. Antidepressant medicines usually are prescribed. Antidepressant medicines are thought to correct chemical imbalances in the brain that are commonly associated with major depressive disorder. Other types of medicine may be added if the symptoms do not respond to antidepressant medicines alone or if psychotic delusions or hallucinations occur.  Talk therapy. Talk therapy can be helpful in treating major depressive disorder by providing support, education, and guidance. Certain types of talk therapy also can help with negative thinking (cognitive behavioral therapy) and with relationship issues that trigger this disorder (interpersonal therapy). A mental health specialist can help determine which treatment is best for you. Most people with major depressive disorder do well with a combination of medicine and talk therapy. Treatments involving electrical stimulation of the brain can be used in situations with extremely severe symptoms or when medicine and talk therapy do not work over time. These treatments include electroconvulsive therapy, transcranial magnetic stimulation, and vagal nerve stimulation.   This information is not intended to replace advice given to you by your health care provider. Make sure you discuss any questions you have with your health care provider.   Document Released: 04/22/2012 Document Revised: 01/16/2014 Document Reviewed: 04/22/2012 Elsevier Interactive Patient Education [employer].

## 2015-06-21 NOTE — Progress Notes (Signed)
Pre visit review using our clinic review tool, if applicable. No additional management support is needed unless otherwise documented below in the visit note. 

## 2015-06-21 NOTE — Progress Notes (Signed)
Subjective:    Patient ID: Samantha Ramos, female    DOB: June 26, 1990, 25 y.o.   MRN: [id number]   Samantha Ramos is a 25 y.o. female who presents today for an acute visit.    HPI Comments: Patient here for evaluation of depression and anxiety worsened the past couple weeks. Felt anxious today and described heart palpitations at the time, since resolved. No SOB, chest pain.  She has been seeing a therapist for several months, seeing her every 2-3 weeks which is helping. A couple years ago she took Prozac for couple months and stopped the medication a couple months later as she felt much better. She denies any thoughts of hurting herself or hurting anyone else. She denies any suicide plan. She notes stress due to an upcoming move in August causing her anxiety. She plans to move to [place], [place]. 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 Currently she works for a [employer]. No significant changes in sleep or eating habits.   Per chart review, patient was seen for depression 2015 and started on Prozac. Past Medical History  Diagnosis Date  . Allergy   . Depression    Allergies: Penicillins No current outpatient prescriptions on file prior to visit.   No current facility-administered medications on file prior to visit.    Social History  Substance Use Topics  . Smoking status: Never Smoker   . Smokeless tobacco: Never Used  . Alcohol Use: 0.0 oz/week    0 Standard drinks or equivalent per week     Comment: occ    Review of Systems  Constitutional: Negative for fever, chills, appetite change and unexpected weight change.  Respiratory: Negative for cough and shortness of breath.   Cardiovascular: Positive for palpitations. Negative for chest pain.  Gastrointestinal: Negative for nausea and vomiting.  Psychiatric/Behavioral: Negative for suicidal ideas, confusion and self-injury. The  patient is nervous/anxious.       Objective:    BP 120/70 mmHg  Pulse 85  Temp(Src) 98 F (36.7 C) (Oral)  Ht 5\' 8"  (1.727 m)  Wt 273 lb (123.832 kg)  BMI 41.52 kg/m2  SpO2 98%  LMP 04/09/2015 (Within Weeks)   Physical Exam  Constitutional: She appears well-developed and well-nourished.  Eyes: Conjunctivae are normal.  Cardiovascular: Normal rate, regular rhythm, normal heart sounds and normal pulses.   Pulmonary/Chest: Effort normal and breath sounds normal. She has no wheezes. She has no rhonchi. She has no rales.  Neurological: She is alert.  Skin: Skin is warm and dry.  Psychiatric: She has a normal mood and affect. Her speech is normal and behavior is normal. Thought content normal.  Vitals reviewed.      Assessment & Plan:   1. Anxiety and depression Miss Mike is a delightful young woman which seems to have a Ramos of transition going on in her life right now. She is slightly tearful during our visit today however seems optimistic about her future, future move, and profession. We jointly agreed to trial Prozac for one month, and she'll make a follow-up appointment with myself or another provider to see how she is doing.   - FLUoxetine (PROZAC) 20 MG tablet; Take 1 tablet (20 mg total) by mouth daily.  Dispense: 30 tablet; Refill: 2    Ms. Rott does not currently have medications on file.   No orders of the defined types were  placed in this encounter.     Start medications as prescribed and explained to patient on After Visit Summary ( AVS). Risks, benefits, and alternatives of the medications and treatment plan prescribed today were discussed, and patient expressed understanding.   Education regarding symptom management and diagnosis given to patient.   Follow-up:Plan follow-up and return precautions given if any worsening symptoms or change in condition.   Continue to follow with Sheral Flow, NP for routine health maintenance.   Samantha Ramos  and I agreed with plan.   Mable Paris, FNP

## 2015-07-06 ENCOUNTER — Encounter: Payer: Self-pay | Admitting: Family Medicine

## 2015-07-06 ENCOUNTER — Ambulatory Visit (INDEPENDENT_AMBULATORY_CARE_PROVIDER_SITE_OTHER): Payer: BLUE CROSS/BLUE SHIELD | Admitting: Family Medicine

## 2015-07-06 ENCOUNTER — Telehealth: Payer: Self-pay

## 2015-07-06 VITALS — BP 122/70 | HR 66 | Temp 98.2°F | Wt 270.8 lb

## 2015-07-06 DIAGNOSIS — K2970 Gastritis, unspecified, without bleeding: Secondary | ICD-10-CM | POA: Insufficient documentation

## 2015-07-06 MED ORDER — ONDANSETRON HCL 4 MG PO TABS: 4.0000 mg | ORAL_TABLET | Freq: Three times a day (TID) | ORAL | Status: DC | PRN

## 2015-07-06 NOTE — Assessment & Plan Note (Signed)
Anticipate viral gastritis, less likely food poisoning. Anticipate self limited illness. Reassured. Supportive care discussed. zofran WASP printed for patient.  Recommended isolation from 19 mo old niece until acute illness has resolved.

## 2015-07-06 NOTE — Telephone Encounter (Signed)
Please check on patient and schedule her for an appointment for tomorrow if her vomiting persists today. Encourage her to stay hydrated with water, small sips.

## 2015-07-06 NOTE — Patient Instructions (Signed)
I do think you have viral gastritis. Push fluids and rest. Bland diet over next 24-48 hours as discussed Let us know if not improving with treatment.   Gastritis, Adult Gastritis is soreness and swelling (inflammation) of the lining of the stomach. Gastritis can develop as a sudden onset (acute) or long-term (chronic) condition. If gastritis is not treated, it can lead to stomach bleeding and ulcers. CAUSES  Gastritis occurs when the stomach lining is weak or damaged. Digestive juices from the stomach then inflame the weakened stomach lining. The stomach lining may be weak or damaged due to viral or bacterial infections. One common bacterial infection is the Helicobacter pylori infection. Gastritis can also result from excessive alcohol consumption, taking certain medicines, or having too much acid in the stomach.  SYMPTOMS  In some cases, there are no symptoms. When symptoms are present, they may include:  Pain or a burning sensation in the upper abdomen.  Nausea.  Vomiting.  An uncomfortable feeling of fullness after eating. DIAGNOSIS  Your caregiver may suspect you have gastritis based on your symptoms and a physical exam. To determine the cause of your gastritis, your caregiver may perform the following:  Blood or stool tests to check for the H pylori bacterium.  Gastroscopy. A thin, flexible tube (endoscope) is passed down the esophagus and into the stomach. The endoscope has a light and camera on the end. Your caregiver uses the endoscope to view the inside of the stomach.  Taking a tissue sample (biopsy) from the stomach to examine under a microscope. TREATMENT  Depending on the cause of your gastritis, medicines may be prescribed. If you have a bacterial infection, such as an H pylori infection, antibiotics may be given. If your gastritis is caused by too much acid in the stomach, H2 blockers or antacids may be given. Your caregiver may recommend that you stop taking aspirin,  ibuprofen, or other nonsteroidal anti-inflammatory drugs (NSAIDs). HOME CARE INSTRUCTIONS  Only take over-the-counter or prescription medicines as directed by your caregiver.  If you were given antibiotic medicines, take them as directed. Finish them even if you start to feel better.  Drink enough fluids to keep your urine clear or pale yellow.  Avoid foods and drinks that make your symptoms worse, such as:  Caffeine or alcoholic drinks.  Chocolate.  Peppermint or mint flavorings.  Garlic and onions.  Spicy foods.  Citrus fruits, such as oranges, lemons, or limes.  Tomato-based foods such as sauce, chili, salsa, and pizza.  Fried and fatty foods.  Eat small, frequent meals instead of large meals. SEEK IMMEDIATE MEDICAL CARE IF:   You have black or dark red stools.  You vomit blood or material that looks like coffee grounds.  You are unable to keep fluids down.  Your abdominal pain gets worse.  You have a fever.  You do not feel better after 1 week.  You have any other questions or concerns. MAKE SURE YOU:  Understand these instructions.  Will watch your condition.  Will get help right away if you are not doing well or get worse.   This information is not intended to replace advice given to you by your health care provider. Make sure you discuss any questions you have with your health care provider.   Document Released: 12/20/2000 Document Revised: 06/27/2011 Document Reviewed: 02/08/2011 Elsevier Interactive Patient Education [employer].

## 2015-07-06 NOTE — Telephone Encounter (Signed)
Patient has appt with Dr Danise Mina on 07/06/2015.

## 2015-07-06 NOTE — Telephone Encounter (Signed)
PLEASE NOTE: All timestamps contained within this report are represented as [place] Standard Time. CONFIDENTIALTY NOTICE: This fax transmission is intended only for the addressee. It contains information that is legally privileged, confidential or otherwise protected from use or disclosure. If you are not the intended recipient, you are strictly prohibited from reviewing, disclosing, copying using or disseminating any of this information or taking any action in reliance on or regarding this information. If you have received this fax in error, please notify us immediately by telephone so that we can arrange for its return to [place]. Phone: [phone], Toll-Free: [phone], Fax: [phone] Page: 1 of 2 Call Id: [id number] Carter Patient Name: Samantha Ramos Gender: Female DOB: 1990-07-23 Age: 25 Y 35 M 25 D Return Phone Number: [phone] (Primary) Address: City/State/Zip: [place] Client [facility] Night - Client Client Site [facility] Physician Alma Friendly - NP Contact Type Call Who Is Calling Patient / Member / Family / Caregiver Call Type Triage / Clinical Relationship To Patient Self Return Phone Number [phone] (Primary) Chief Complaint Abdominal Pain Reason for Call Symptomatic / Request for Health Information Initial Comment She woke up feeling sick, abdominal pain, vomiting PreDisposition Call Doctor Translation No Nurse Assessment Nurse: Sonda Primes, RN, Clarisa Fling Date/Time (Eastern Time): 07/06/2015 4:53:19 AM Confirm and document reason for call. If symptomatic, describe symptoms. You must click the next button to save text entered. ---Caller states she woke up at 3 am feeling sick, left upper abdominal pain 4/10 below ribcage, vomiting 10 times, and 1 bout of diarrhea. Has the patient traveled out of the country within the  last 30 days? ---No Does the patient have any new or worsening symptoms? ---Yes Will a triage be completed? ---Yes Related visit to physician within the last 2 weeks? ---No Does the PT have any chronic conditions? (i.e. diabetes, asthma, etc.) ---No Is the patient pregnant or possibly pregnant? (Ask all females between the ages of 36-55) ---No Is this a behavioral health or substance abuse call? ---No Guidelines Guideline Title Affirmed Question Affirmed Notes Nurse Date/Time Eilene Ghazi Time) Abdominal Pain - Upper Abdominal pain Henard, RN, Clarisa Fling 07/06/2015 4:56:02 AM Vomiting [1] Constant abdominal pain AND [2] present > 2 hours Henard, RN, Clarisa Fling 07/06/2015 5:01:53 AM Disp. Time Eilene Ghazi Time) Disposition Final User 07/06/2015 5:01:16 AM Home Care Henard, RN, Clarisa Fling PLEASE NOTE: All timestamps contained within this report are represented as [place] Standard Time. CONFIDENTIALTY NOTICE: This fax transmission is intended only for the addressee. It contains information that is legally privileged, confidential or otherwise protected from use or disclosure. If you are not the intended recipient, you are strictly prohibited from reviewing, disclosing, copying using or disseminating any of this information or taking any action in reliance on or regarding this information. If you have received this fax in error, please notify us immediately by telephone so that we can arrange for its return to [place]. Phone: [phone], Toll-Free: [phone], Fax: [phone] Page: 2 of 2 Call Id: [id number] 07/06/2015 5:04:47 AM See Physician within 4 Hours (or PCP triage) Yes Henard, RN, Rosine Door Understands: Yes Disagree/Comply: Comply Caller Understands: Yes Disagree/Comply: Comply Care Advice Given Per Guideline HOME CARE: You should be able to treat this at home. * Drink clear liquids only (e.g., water, flat soft drinks or half-strength Gatorade), small amounts at a time, until the pain is resolved for  2 hours. * Do not take  aspirin and anti-inflammatory medications (i.e., NSAIDS like motrin, advil, aleve, naproxen) unless approved/instructed to by your physician. These medications can cause stomach irritation. CALL BACK IF: * Severe pain present over 1 hour * Constant pain present over 2 hours * Intermittent pain lasts over 48 hours * You become worse. CARE ADVICE given per Abdominal Pain, Upper (Adult) guideline. SEE PHYSICIAN WITHIN 4 HOURS (or PCP triage): CALL BACK IF: * You become worse. NOTHING BY MOUTH: Do not eat or drink anything for now. CARE ADVICE per Vomiting (Adult) guideline. Referrals REFERRED TO PCP OFFICE

## 2015-07-06 NOTE — Progress Notes (Signed)
   BP 122/70 mmHg  Pulse 66  Temp(Src) 98.2 F (36.8 C) (Oral)  Wt 270 lb 12 oz (122.811 kg)  SpO2 97%  LMP 06/26/2015 (Exact Date)   CC: vomiting  Subjective:    Patient ID: [name], female    DOB: [birth year], 25 y.o.   MRN: [id number]  HPI: Anylia Madson is a 25 y.o. female presenting on 07/06/2015 for Emesis   Woke up this morning at 3 am with nausea/vomiting emesis x10 over 2 hour period. NBNB. Some chills overnight.   No fevers, abd pain, diarrhea/constipation. No blood in stool. No UTI sxs.   Has been drinking ginger ale, fruit popsicles.  Had [place] and chicken tenders at work. Friend not sick.  No sick contacts at home.   Tried some tums today which may have helped.   LMP 06/26/2015, normally irregular periods.  Just started junel birth control this month.   Relevant past medical, surgical, family and social history reviewed and updated as indicated. Interim medical history since our last visit reviewed. Allergies and medications reviewed and updated. Current Outpatient Prescriptions on File Prior to Visit  Medication Sig  . FLUoxetine (PROZAC) 20 MG tablet Take 1 tablet (20 mg total) by mouth daily.   No current facility-administered medications on file prior to visit.    Review of Systems Per HPI unless specifically indicated in ROS section     Objective:    BP 122/70 mmHg  Pulse 66  Temp(Src) 98.2 F (36.8 C) (Oral)  Wt 270 lb 12 oz (122.811 kg)  SpO2 97%  LMP 06/26/2015 (Exact Date)  Wt Readings from Last 3 Encounters:  07/06/15 270 lb 12 oz (122.811 kg)  06/21/15 273 lb (123.832 kg)  02/16/15 271 lb 1.9 oz (122.979 kg)    Physical Exam  Constitutional: She appears well-developed and well-nourished. No distress.  HENT:  Mouth/Throat: Oropharynx is clear and moist. No oropharyngeal exudate.  Cardiovascular: Normal rate, regular rhythm, normal heart sounds and intact distal pulses.   No murmur heard. Pulmonary/Chest: Effort normal and breath  sounds normal. No respiratory distress. She has no wheezes. She has no rales.  Abdominal: Soft. Normal appearance and bowel sounds are normal. She exhibits no distension and no mass. There is no hepatosplenomegaly. There is tenderness in the epigastric area and left upper quadrant. There is no rigidity, no rebound, no guarding, no CVA tenderness and negative Murphy's sign.  Musculoskeletal: She exhibits no edema.  Psychiatric: She has a normal mood and affect.  Nursing note and vitals reviewed.  Lab Results  Component Value Date   CREATININE 0.73 02/16/2015       Assessment & Plan:   Problem List Items Addressed This Visit    Viral gastritis - Primary    Anticipate viral gastritis, less likely food poisoning. Anticipate self limited illness. Reassured. Supportive care discussed. zofran WASP printed for patient.  Recommended isolation from 22 mo old niece until acute illness has resolved.       Relevant Medications   nystatin cream (MYCOSTATIN)       Follow up plan: Return if symptoms worsen or fail to improve.  Ria Bush, MD

## 2015-07-06 NOTE — Progress Notes (Signed)
Pre visit review using our clinic review tool, if applicable. No additional management support is needed unless otherwise documented below in the visit note. 

## 2015-07-07 ENCOUNTER — Telehealth: Payer: Self-pay | Admitting: Primary Care

## 2015-07-07 NOTE — Telephone Encounter (Signed)
Patient Name: Samantha Ramos  DOB: 17-Feb-1990    Initial Comment Caller states she was seen yesterday, still having bad stomach pains.   Nurse Assessment  Nurse: Leilani Merl, RN, Heather Date/Time (Eastern Time): 07/07/2015 1:29:04 PM  Confirm and document reason for call. If symptomatic, describe symptoms. You must click the next button to save text entered. ---Caller states she was seen yesterday, still having bad stomach pressure  Has the patient traveled out of the country within the last 30 days? ---Not Applicable  Does the patient have any new or worsening symptoms? ---Yes  Will a triage be completed? ---Yes  Related visit to physician within the last 2 weeks? ---No  Does the PT have any chronic conditions? (i.e. diabetes, asthma, etc.) ---No  Is the patient pregnant or possibly pregnant? (Ask all females between the ages of 37-55) ---No  Is this a behavioral health or substance abuse call? ---No     Guidelines    Guideline Title Affirmed Question Affirmed Notes  Abdominal Pain - Female [1] MILD-MODERATE pain AND [2] comes and goes (cramps)    Final Disposition User   [place], RN, Water quality scientist    Disagree/Comply: Comply

## 2015-07-07 NOTE — Telephone Encounter (Signed)
Noted. Looks like team health provided her with education and reassurance. Based of provider note she seems stable and viral in nature. Please check on patient this Friday June 30th.

## 2015-07-07 NOTE — Telephone Encounter (Signed)
PLEASE NOTE: All timestamps contained within this report are represented as [place] Standard Time. CONFIDENTIALTY NOTICE: This fax transmission is intended only for the addressee. It contains information that is legally privileged, confidential or otherwise protected from use or disclosure. If you are not the intended recipient, you are strictly prohibited from reviewing, disclosing, copying using or disseminating any of this information or taking any action in reliance on or regarding this information. If you have received this fax in error, please notify us immediately by telephone so that we can arrange for its return to [place]. Phone: [phone], Toll-Free: [phone], Fax: [phone] Page: 1 of 2 Call Id: [id number] [place] Patient Name: Samantha Ramos Gender: Female DOB: [birth year] Age: 25 Y 81 M 26 D Return Phone Number: [phone] (Primary) Address: City/State/Zip: [place]  [address] Client [facility] - Client Client Site [place] Physician Ria Bush - MD Contact Type Call Who Is Calling Patient / Member / Family / Caregiver Call Type Triage / Clinical Relationship To Patient Self Return Phone Number [phone] (Primary) Chief Complaint Abdominal Pain Reason for Call Symptomatic / Request for [name] states she was seen yesterday, still having bad stomach pains. Appointment Disposition EMR Appointment Not Necessary Info pasted into Epic Yes PreDisposition Call Doctor Translation No Nurse Assessment Nurse: Leilani Merl, RN, Heather Date/Time (Eastern Time): 07/07/2015 1:29:04 PM Confirm and document reason for call. If symptomatic, describe symptoms. You must click the next button to save text entered. ---Caller states she was seen yesterday, still having bad stomach pressure Has  the patient traveled out of the country within the last 30 days? ---Not Applicable Does the patient have any new or worsening symptoms? ---Yes Will a triage be completed? ---Yes Related visit to physician within the last 2 weeks? ---No Does the PT have any chronic conditions? (i.e. diabetes, asthma, etc.) ---No Is the patient pregnant or possibly pregnant? (Ask all females between the ages of 36-55) ---No Is this a behavioral health or substance abuse call? ---No Guidelines Guideline Title Affirmed Question Affirmed Notes Nurse Date/Time (Eastern Time) Abdominal Pain - Female [1] MILD-MODERATE pain AND [2] comes and goes (cramps) [place], RN, Heather 07/07/2015 1:29:52 PM Disp. Time Eilene Ghazi Time) Disposition Final User 07/07/2015 1:39:53 PM Call Completed [place], RN, Heather PLEASE NOTE: All timestamps contained within this report are represented as [place] Standard Time. CONFIDENTIALTY NOTICE: This fax transmission is intended only for the addressee. It contains information that is legally privileged, confidential or otherwise protected from use or disclosure. If you are not the intended recipient, you are strictly prohibited from reviewing, disclosing, copying using or disseminating any of this information or taking any action in reliance on or regarding this information. If you have received this fax in error, please notify us immediately by telephone so that we can arrange for its return to [place]. Phone: [phone], Toll-Free: [phone], Fax: [phone] Page: 2 of 2 Call Id: [id number] 07/07/2015 1:34:06 PM [place], RN, Soyla Murphy Understands: Yes Disagree/Comply: Comply Care Advice Given Per Guideline HOME CARE: You should be able to treat this at home. REASSURANCE: * It doesn't sound like a serious stomachache. CALL BACK IF: * You become worse. CARE ADVICE given per Abdominal Pain, Female (Adult) guideline.

## 2015-07-09 NOTE — Telephone Encounter (Signed)
Noted and glad to hear! 

## 2015-07-09 NOTE — Telephone Encounter (Signed)
Spoken to patient and she is better today. Stomach pain has been gone since last night.

## 2015-07-20 ENCOUNTER — Telehealth: Payer: Self-pay | Admitting: Primary Care

## 2015-07-20 ENCOUNTER — Ambulatory Visit: Payer: BLUE CROSS/BLUE SHIELD | Admitting: Primary Care

## 2015-07-20 DIAGNOSIS — Z0289 Encounter for other administrative examinations: Secondary | ICD-10-CM

## 2015-07-20 NOTE — Telephone Encounter (Signed)
Patient did not come in for their appointment today for follow up from [facility].  Please let me know if patient needs to be contacted immediately for follow up or no follow up needed.

## 2015-07-20 NOTE — Telephone Encounter (Signed)
Please reschedule patient for follow-up at her convenience.

## 2015-08-03 NOTE — Telephone Encounter (Signed)
Scheduled for 07/28 at 9 am

## 2015-08-06 ENCOUNTER — Ambulatory Visit: Payer: BLUE CROSS/BLUE SHIELD | Admitting: Primary Care

## 2015-08-09 ENCOUNTER — Ambulatory Visit (INDEPENDENT_AMBULATORY_CARE_PROVIDER_SITE_OTHER): Payer: BLUE CROSS/BLUE SHIELD | Admitting: Primary Care

## 2015-08-09 ENCOUNTER — Encounter: Payer: Self-pay | Admitting: Primary Care

## 2015-08-09 DIAGNOSIS — F329 Major depressive disorder, single episode, unspecified: Secondary | ICD-10-CM

## 2015-08-09 DIAGNOSIS — F419 Anxiety disorder, unspecified: Principal | ICD-10-CM

## 2015-08-09 DIAGNOSIS — F411 Generalized anxiety disorder: Secondary | ICD-10-CM

## 2015-08-09 DIAGNOSIS — F32A Depression, unspecified: Secondary | ICD-10-CM

## 2015-08-09 DIAGNOSIS — F418 Other specified anxiety disorders: Secondary | ICD-10-CM

## 2015-08-09 MED ORDER — FLUOXETINE HCL 20 MG PO TABS: 20.0000 mg | ORAL_TABLET | Freq: Every day | ORAL | 2 refills | Status: DC

## 2015-08-09 NOTE — Progress Notes (Signed)
Subjective:    Patient ID: Samantha Ramos, female    DOB: 05-12-1990, 25 y.o.   MRN: [id number]  HPI  Samantha Ramos is a 25 year old female who presents today for follow up of anxiety and depression. She was evaluated on 06/12 for an acute visit with a chief complaint of anxiety and depression. Her symptoms of palpitations and anxiety had increased over the past several days prior. She was previously managed on Prozac in the past for several months but stopped taking as she felt improved. During her visit she was re-initiated on her Prozac.  Since her evaluation in June she's noticed improvement in her anxiety. She's noticed more stable emotions and is able to respond to stress more effectively. She's been under a Ramos of pressure as she's figuring out her occupation and living situation. Overall she's feeling improved.  She has noticed grinding of her teeth at night for the past 2-3 weeks, she does have a history of TMJ. She's currently alternating between taking 1/2 tablet and 1 full tablet of her Prozac since initiation in June. Denies headaches, suicidal thoughts, GI upset. She is seeing her therapist every 2-3 weeks.   Review of Systems  Respiratory: Negative for shortness of breath.   Cardiovascular: Negative for palpitations.  Psychiatric/Behavioral: Negative for sleep disturbance and suicidal ideas. The patient is nervous/anxious.        Past Medical History:  Diagnosis Date  . Allergy   . Depression      Social History   Social History  . Marital status: Single    Spouse name: N/A  . Number of children: N/A  . Years of education: N/A   Occupational History  . Not on file.   Social History Main Topics  . Smoking status: Never Smoker  . Smokeless tobacco: Never Used  . Alcohol use 0.0 oz/week     Comment: occ  . Drug use: No  . Sexual activity: Not on file   Other Topics Concern  . Not on file   Social History Narrative   Grades A's and B's   Culinary Arts or  Engineer, manufacturing and Swim team   Diet: irregular eating, gatorade and chips at lunch, some fruits, and occ veggies   Parents divorced when he was 44, got some counseling then    Past Surgical History:  Procedure Laterality Date  . DENTAL SURGERY      Family History  Problem Relation Age of Onset  . Hypertension Mother   . Hyperlipidemia Mother   . Depression Mother   . Cholelithiasis Mother   . Thyroid disease Mother   . Diabetes Maternal Grandmother   . Heart disease Maternal Grandmother     CVA  . Diabetes Paternal Grandmother   . Coronary artery disease Paternal Grandfather   . Cancer Cousin     Leukemia  . Hypertension Father   . Diabetes Father     Allergies  Allergen Reactions  . Penicillins     REACTION: Rash    Current Outpatient Prescriptions on File Prior to Visit  Medication Sig Dispense Refill  . ibuprofen (ADVIL,MOTRIN) 200 MG tablet Take 200 mg by mouth every 6 (six) hours as needed.    Samantha Ramos FE 1/20 1-20 MG-MCG tablet Take 1 tablet by mouth daily.  2  . nystatin cream (MYCOSTATIN) Apply 1 application topically 2 (two) times daily. APPLY A SMALL AMOUNT TO THE AFFECTED AREAS BY TOPICAL ROUTE 2 TIMES A DAY AS NEEDED  1  . Pseudoephedrine-Acetaminophen (TYLENOL SINUS MAX ST PO) Take by mouth.     No current facility-administered medications on file prior to visit.     BP 122/72 (BP Location: Left Arm, Patient Position: Sitting, Cuff Size: Normal)   Pulse 78   Temp 98 F (36.7 C) (Oral)   Ht 5\' 8"  (1.727 m)   Wt 271 lb 12.8 oz (123.3 kg)   LMP 08/09/2015   SpO2 98%   BMI 41.33 kg/m    Objective:   Physical Exam  Constitutional: She appears well-nourished.  Cardiovascular: Normal rate and regular rhythm.   Pulmonary/Chest: Effort normal and breath sounds normal.  Skin: Skin is warm and dry.  Psychiatric: She has a normal mood and affect.          Assessment & Plan:

## 2015-08-09 NOTE — Patient Instructions (Signed)
Start taking 1 full tablet of your Prozac once daily.   Continue meeting with your therapist.  Purchase a mouth guard at the drug store to wear at night while sleeping.  Follow up in 6 months for re-evaluation or sooner if needed.  It was a pleasure to see you today!

## 2015-08-09 NOTE — Assessment & Plan Note (Signed)
Evaluated in June and reinitiated on Prozac 20 mg. Overall improvement in anxiety and stress level since initiation. Discussed that she is to take 1 full tablet every day and not alternating between one half tablet and one full tablet. Denies SI/HI. Appears well today. Continue with therapy, follow-up in 6 months for reevaluation or sooner if needed.

## 2015-08-09 NOTE — Progress Notes (Signed)
Pre visit review using our clinic review tool, if applicable. No additional management support is needed unless otherwise documented below in the visit note. 

## 2015-08-13 ENCOUNTER — Telehealth: Payer: Self-pay | Admitting: Primary Care

## 2015-08-13 NOTE — Telephone Encounter (Signed)
[facility] Patient Name: JUSTIN BROWNFIELD DOB: [birth year] Initial Comment Caller states she's having severe upper abdominal pain. Nurse Assessment Nurse: Markus Daft, RN, Sherre Poot Date/Time (Eastern Time): 08/13/2015 12:59:16 PM Confirm and document reason for call. If symptomatic, describe symptoms. You must click the next button to save text entered. ---Caller states she's had severe upper abdominal pain around 2 am. She was already awake. Nauseated at the time. She had a BM, and urinated ok. Vomited once at the time. Felt a little better after vomiting, but pain was still there. Took Tums and able to go back to bed. Reports some upper abdominal tightness, rates 1/10 now and not all the time. This is episode has been for about 10 min. -- This happened last week also, but it woke her from her sleep, and vomited once and nothing more. - No diarrhea or fever. Has the patient traveled out of the country within the last 30 days? ---No Does the patient have any new or worsening symptoms? ---Yes Will a triage be completed? ---Yes Related visit to physician within the last 2 weeks? ---No Does the PT have any chronic conditions? (i.e. diabetes, asthma, etc.) ---Yes List chronic conditions. ---Depression/Anxiety Is the patient pregnant or possibly pregnant? (Ask all females between the ages of 25-55) ---No Is this a behavioral health or substance abuse call? ---No Guidelines Guideline Title Affirmed Question Affirmed Notes Abdominal Pain - Upper Abdominal pain (all triage questions negative) Final Disposition User [place], RN, [place] Disagree/Comply: Comply

## 2015-08-13 NOTE — Telephone Encounter (Signed)
Please call and check on [facility] Monday 08/07. How's she feeling? Any abdominal pain or vomiting?

## 2015-08-16 NOTE — Telephone Encounter (Signed)
Spoken to patient and she is better now. No abdominal pain and no vomiting.

## 2015-08-16 NOTE — Telephone Encounter (Signed)
Glad to hear. 

## 2015-11-04 ENCOUNTER — Telehealth: Payer: Self-pay | Admitting: Primary Care

## 2015-11-04 NOTE — Telephone Encounter (Signed)
PLEASE NOTE: All timestamps contained within this report are represented as [place] Standard Time. CONFIDENTIALTY NOTICE: This fax transmission is intended only for the addressee. It contains information that is legally privileged, confidential or otherwise protected from use or disclosure. If you are not the intended recipient, you are strictly prohibited from reviewing, disclosing, copying using or disseminating any of this information or taking any action in reliance on or regarding this information. If you have received this fax in error, please notify us immediately by telephone so that we can arrange for its return to [place]. Phone: [phone], Toll-Free: [phone], Fax: [phone] Page: 1 of 2 Call Id: [id number] [place] Patient Name: Samantha Ramos Gender: Female DOB: Mar 10, 1990 Age: 25 Y 11 M 24 D Return Phone Number: [phone] (Primary) Address: City/State/Zip: [place] [address] Client [facility] - Client Client Site [place] - Day Physician Alma Friendly - NP Contact Type Call Who Is Calling Patient / Member / Family / Caregiver Call Type Triage / Clinical Relationship To Patient Self Return Phone Number [phone] (Primary) Chief Complaint Cough Reason for Call Symptomatic / Request for [place] states has a bad cough, very runny nose Appointment Disposition EMR Appointment Not Necessary Info pasted into Epic Yes PreDisposition Call Doctor Translation No Nurse Assessment Nurse: Mallie Mussel, RN, Alveta Heimlich Date/Time (Eastern Time): 11/04/2015 9:19:41 AM Confirm and document reason for call. If symptomatic, describe symptoms. You must click the next button to save text entered. ---Caller states that she has had a cough which began yesterday and got worse this morning. She has a runny nose since  Monday. Denies difficulty breathing. Denies fever. She feels some pressure in the cheekbones, eyes and forehead area. Denies headache. Has the patient traveled out of the country within the last 30 days? ---No Does the patient have any new or worsening symptoms? ---Yes Will a triage be completed? ---Yes Related visit to physician within the last 2 weeks? ---No Does the PT have any chronic conditions? (i.e. diabetes, asthma, etc.) ---No Is the patient pregnant or possibly pregnant? (Ask all females between the ages of 17-55) ---No Is this a behavioral health or substance abuse call? ---No Guidelines Guideline Title Affirmed Question Affirmed Notes Nurse Date/Time Eilene Ghazi Time) Common Cold Cold with no complications (all triage questions negative) Mallie Mussel, RN, Alveta Heimlich 11/04/2015 9:22:25 AM Disp. Time Eilene Ghazi Time) Disposition Final User 11/04/2015 9:27:57 AM [place], RN, Alveta Heimlich PLEASE NOTE: All timestamps contained within this report are represented as [place] Standard Time. CONFIDENTIALTY NOTICE: This fax transmission is intended only for the addressee. It contains information that is legally privileged, confidential or otherwise protected from use or disclosure. If you are not the intended recipient, you are strictly prohibited from reviewing, disclosing, copying using or disseminating any of this information or taking any action in reliance on or regarding this information. If you have received this fax in error, please notify us immediately by telephone so that we can arrange for its return to [place]. Phone: [phone], Toll-Free: [phone], Fax: [phone] Page: 2 of 2 Call Id: [id number] Caller Understands: Yes Disagree/Comply: Comply Care Advice Given Per Guideline HOME CARE: You should be able to treat this at home. * It sounds like an uncomplicated cold that we can treat at home. * Introduction: Saline (salt water) nasal irrigation (nasal wash) is an effective and  simple home remedy for treating stuffy nose  and sinus congestion. The nose can be irrigated by pouring, spraying, or squirting salt water into the nose and then letting it run back out. * Blowing your nose helps clean out your nose. Use a handkerchief or a paper tissue. * Nasal mucus and discharge help wash viruses and bacteria out of the nose and sinuses. * PSEUDOEPHEDRINE (Sudafed) is available OTC in pill form. Typical adult dosage is two 30 mg tablets every 6 hours. * PHENYLEPHRINE (Sudafed PE) is available OTC in pill form. Typical adult dosage is one 10 mg tablet every 4 hours. * If you have a very stuffy nose, nasal decongestant medicines can shrink the swollen nasal mucosa and allow for easier breathing. If you have a very runny nose, these medicines can reduce the amount of drainage. They may be taken as pills by mouth or as a nasal spray. * OXYMETAZOLINE NASAL DROPS (Afrin) are available OTC. Clean out the nose before using. Spray each nostril once, wait one minute for absorption, and then spray a second time. * PHENYLEPHRINE NASAL DROPS (Neo-Synephrine) are available OTC. Clean out the nose before using. Spray each nostril once, wait one minute for absorption, and then spray a second time. * Do not take these medications if you have high blood pressure, heart disease, prostate enlargement, or an overactive thyroid. * Do not take these medications if you are pregnant. * For muscle aches, headaches, or moderate fever (over 101 degrees F) (38.9 C) use acetaminophen every 4 hours. * Sore throat: throat lozenges, hard candy or warm chicken broth. * Cough: use cough drops. * Hydrate: drink extra liquids. HUMIDIFIER: If the air in your home is dry, use a humidifier. * The cold virus is present in your nasal secretions. * Cover your nose and mouth with a tissue when you sneeze or cough. * Wash your hands frequently with soap and water. * Fever 2-3 days * Nasal discharge 7-14 days * Cough 2-3 weeks.  * Fever lasts over 3 days * Runny nose lasts over 10 days * You become short of breath * You become worse. * You can return to work or school after the fever is gone and you feel well enough to participate in normal activities.

## 2015-11-04 NOTE — Telephone Encounter (Signed)
Patient Name: Samantha Ramos  DOB: May 27, 1990    Initial Comment Caller states has a bad cough, very runny nose   Nurse Assessment  Nurse: Mallie Mussel, RN, Alveta Heimlich Date/Time (Eastern Time): 11/04/2015 9:19:41 AM  Confirm and document reason for call. If symptomatic, describe symptoms. You must click the next button to save text entered. ---Caller states that she has had a cough which began yesterday and got worse this morning. She has a runny nose since Monday. Denies difficulty breathing. Denies fever. She feels some pressure in the cheekbones, eyes and forehead area. Denies headache.  Has the patient traveled out of the country within the last 30 days? ---No  Does the patient have any new or worsening symptoms? ---Yes  Will a triage be completed? ---Yes  Related visit to physician within the last 2 weeks? ---No  Does the PT have any chronic conditions? (i.e. diabetes, asthma, etc.) ---No  Is the patient pregnant or possibly pregnant? (Ask all females between the ages of 55-55) ---No  Is this a behavioral health or substance abuse call? ---No     Guidelines    Guideline Title Affirmed Question Affirmed Notes  Common Cold Cold with no complications (all triage questions negative)    Final Disposition User   [place], RN, Alveta Heimlich    Disagree/Comply: Leta Baptist

## 2015-11-04 NOTE — Telephone Encounter (Signed)
Noted  

## 2015-11-23 ENCOUNTER — Telehealth: Payer: Self-pay

## 2015-11-23 NOTE — Telephone Encounter (Signed)
Pt request refill prozac to [facility]. Advised pt spoke with Janett Billow at [facility] [address] and will get rx ready for pick up in 2 hrs. Pt voiced understanding.

## 2016-01-31 ENCOUNTER — Encounter: Payer: Self-pay | Admitting: Family Medicine

## 2016-01-31 ENCOUNTER — Ambulatory Visit (INDEPENDENT_AMBULATORY_CARE_PROVIDER_SITE_OTHER): Payer: BLUE CROSS/BLUE SHIELD | Admitting: Family Medicine

## 2016-01-31 VITALS — BP 126/76 | HR 84 | Temp 98.2°F | Wt 284.8 lb

## 2016-01-31 DIAGNOSIS — J069 Acute upper respiratory infection, unspecified: Secondary | ICD-10-CM

## 2016-01-31 MED ORDER — AZITHROMYCIN 250 MG PO TABS: ORAL_TABLET | ORAL | 0 refills | Status: DC

## 2016-01-31 NOTE — Progress Notes (Addendum)
SUBJECTIVE:  Samantha Ramos is a 26 y.o. female pt of Allie Bossier, new to me, who complains of coryza, congestion, sneezing, sore throat, dry cough and bilateral sinus pain for 8 days. She denies a history of anorexia and chest pain and denies a history of asthma. Patient denies smoke cigarettes.   Current Outpatient Prescriptions on File Prior to Visit  Medication Sig Dispense Refill  . FLUoxetine (PROZAC) 20 MG tablet Take 1 tablet (20 mg total) by mouth daily. 90 tablet 2  . ibuprofen (ADVIL,MOTRIN) 200 MG tablet Take 200 mg by mouth every 6 (six) hours as needed.    . Pseudoephedrine-Acetaminophen (TYLENOL SINUS MAX ST PO) Take by mouth.     No current facility-administered medications on file prior to visit.     Allergies  Allergen Reactions  . Penicillins     REACTION: Rash    Past Medical History:  Diagnosis Date  . Allergy   . Depression     Past Surgical History:  Procedure Laterality Date  . DENTAL SURGERY      Family History  Problem Relation Age of Onset  . Hypertension Mother   . Hyperlipidemia Mother   . Depression Mother   . Cholelithiasis Mother   . Thyroid disease Mother   . Diabetes Maternal Grandmother   . Heart disease Maternal Grandmother     CVA  . Diabetes Paternal Grandmother   . Coronary artery disease Paternal Grandfather   . Cancer Cousin     Leukemia  . Hypertension Father   . Diabetes Father     Social History   Social History  . Marital status: Single    Spouse name: N/A  . Number of children: N/A  . Years of education: N/A   Occupational History  . Not on file.   Social History Main Topics  . Smoking status: Never Smoker  . Smokeless tobacco: Never Used  . Alcohol use 0.0 oz/week     Comment: occ  . Drug use: No  . Sexual activity: Not on file   Other Topics Concern  . Not on file   Social History Narrative   Grades A's and B's   Culinary Arts or Engineer, manufacturing and Swim team   Diet: irregular eating,  gatorade and chips at lunch, some fruits, and occ veggies   Parents divorced when he was 89, got some counseling then   The PMH, PSH, Social History, Family History, Medications, and allergies have been reviewed in [facility], and have been updated if relevant.  OBJECTIVE: BP 126/76   Pulse 84   Temp 98.2 F (36.8 C) (Oral)   Wt 284 lb 12 oz (129.2 kg)   LMP 01/02/2016   SpO2 97%   BMI 43.30 kg/m   She appears well, vital signs are as noted. Left TM dull, bulging, Right TM normal.  Throat and pharynx normal.  Neck supple. No adenopathy in the neck. Nose is congested. Sinuses non tender. The chest is clear, without wheezes or rales.  ASSESSMENT:  otitis media and sinusitis  PLAN: Zpack Symptomatic therapy suggested: push fluids, rest and return office visit prn if symptoms persist or worsen.Call or return to clinic prn if these symptoms worsen or fail to improve as anticipated.

## 2016-01-31 NOTE — Progress Notes (Signed)
Pre visit review using our clinic review tool, if applicable. No additional management support is needed unless otherwise documented below in the visit note. 

## 2016-03-15 ENCOUNTER — Telehealth: Payer: Self-pay

## 2016-03-15 NOTE — Telephone Encounter (Signed)
Pt left v/m; pt's dog is registered as emotional support dog and the dog has helped pt and pt not taking as much fluoxetine for anxiety and depression. pts apartment complex is fine with her having a dog but pts mothers apartment complex wants Samantha Ramos's doctor to sign off on it and there are some papers that need to be filled out for the emotional support dog to even visit pts mothers apartment. [name] wants to know if can fax over paperwork. Pt request cb.

## 2016-03-16 NOTE — Telephone Encounter (Signed)
I'm happy to support this, but we need to see the patient in the office for discussion and follow up. Please schedule at her convenience.

## 2016-03-16 NOTE — Telephone Encounter (Signed)
Per DPR, left detail message for patient to return my call.

## 2016-03-21 ENCOUNTER — Telehealth: Payer: Self-pay | Admitting: Primary Care

## 2016-03-21 ENCOUNTER — Ambulatory Visit: Payer: BLUE CROSS/BLUE SHIELD | Admitting: Primary Care

## 2016-03-21 NOTE — Telephone Encounter (Signed)
Patient did not come in for their appointment today for follow up Please let me know if patient needs to be contacted immediately for follow up or no follow up needed. Do you want to charge the NSF?

## 2016-03-21 NOTE — Telephone Encounter (Signed)
Yes, please reschedule at her convenience. Do not charge fee due to weather.

## 2016-03-23 NOTE — Telephone Encounter (Signed)
Rescheduled for 03/19

## 2016-03-24 ENCOUNTER — Ambulatory Visit: Payer: BLUE CROSS/BLUE SHIELD | Admitting: Primary Care

## 2016-03-27 ENCOUNTER — Encounter: Payer: Self-pay | Admitting: Primary Care

## 2016-03-27 ENCOUNTER — Ambulatory Visit (INDEPENDENT_AMBULATORY_CARE_PROVIDER_SITE_OTHER): Payer: BLUE CROSS/BLUE SHIELD | Admitting: Primary Care

## 2016-03-27 DIAGNOSIS — F411 Generalized anxiety disorder: Secondary | ICD-10-CM | POA: Diagnosis not present

## 2016-03-27 DIAGNOSIS — Z6841 Body Mass Index (BMI) 40.0 and over, adult: Secondary | ICD-10-CM | POA: Diagnosis not present

## 2016-03-27 NOTE — Assessment & Plan Note (Signed)
Felt better on fluoxetine 20 mg, will restart slowly at 10 mg. Discussed that she should not abruptly stop this medication and to call in the future if interested in stopping. Letter provided for support animal as this seems beneficial.  She will follow up PRN.

## 2016-03-27 NOTE — Progress Notes (Signed)
Pre visit review using our clinic review tool, if applicable. No additional management support is needed unless otherwise documented below in the visit note. 

## 2016-03-27 NOTE — Patient Instructions (Signed)
Restart fluoxetine 20 mg tablets for anxiety and depression. Start by taking 1/2 tablet by mouth for six days, then advance to 1 full tablet thereafter.  Please notify me if you stick with the 10 mg dose as discussed.  Start exercising. You should be getting 150 minutes of moderate intensity exercise weekly.  Start calorie counting for weight loss. Stick to around 1400-1500 calories daily. Download the APP "My Fitness Pal".  It's important to improve your diet by reducing consumption of fast food, fried food, processed snack foods, sugary drinks. Increase consumption of fresh vegetables and fruits, whole grains, water.  Ensure you are drinking 64 ounces of water daily.  It was a pleasure to see you today!

## 2016-03-27 NOTE — Assessment & Plan Note (Signed)
Above goal today. Discussed calorie counting, exercise, healthy diet.

## 2016-03-27 NOTE — Progress Notes (Signed)
Subjective:    Patient ID: Samantha Ramos, female    DOB: Apr 01, 1990, 26 y.o.   MRN: [id number]  HPI  Samantha Ramos is a 26 year old female who presents today for follow up of anxiety. She is currently managed on Fluoxetine 20 mg for GAD that was initiated in June 2017. She endorsed improvement in anxiety during her follow up appointment in July 2017.  Since her last visit she's stopped taking Fluoxetine and has not had it in over 1 month. She stopped taking this medication as she started feeling better and had a hard time remembering to take it daily.  She has noticed some symptoms of anxiety since she's been off of her Fluoxetine. She has had some headaches, mood changes, hard time dealing with stress at work. She did stop this medication abruptly 1 month ago. She did feel better when on Fluoxetine which helped reduce depression, stress, irritability, and overall anxiety.   She has a support animal dog that is trained to reduce anxiety. She received him in October 2017. Since she's had her dog she has noticed improvement in depression, feels needed, a reduction in irritability and overall anxiousness. She needs a letter from [place] stating that the dog may travel with her in places that are allowed to reduce her anxiety.  She doesn't take her dog to grocery stores or restaurants. She mostly takes her support animal to her mother's apartment, work, and outdoor public places. Her mother's apartment is requesting a letter from PCP in order to allow the dog into the building.  She denies SI/HI. Felt better with Fluoxetine, and has noticed improvement since she's had the support animal.   Review of Systems  Respiratory: Negative for shortness of breath.   Cardiovascular: Negative for chest pain.  Neurological: Negative for headaches.  Psychiatric/Behavioral: Negative for sleep disturbance and suicidal ideas. The patient is nervous/anxious.        Felt better on Fluoxetine, would like to resume.         Past Medical History:  Diagnosis Date  . Allergy   . Depression      Social History   Social History  . Marital status: Single    Spouse name: N/A  . Number of children: N/A  . Years of education: N/A   Occupational History  . Not on file.   Social History Main Topics  . Smoking status: Never Smoker  . Smokeless tobacco: Never Used  . Alcohol use 0.0 oz/week     Comment: occ  . Drug use: No  . Sexual activity: Not on file   Other Topics Concern  . Not on file   Social History Narrative   Grades A's and B's   Culinary Arts or Engineer, manufacturing and Swim team   Diet: irregular eating, gatorade and chips at lunch, some fruits, and occ veggies   Parents divorced when he was 89, got some counseling then    Past Surgical History:  Procedure Laterality Date  . DENTAL SURGERY      Family History  Problem Relation Age of Onset  . Hypertension Mother   . Hyperlipidemia Mother   . Depression Mother   . Cholelithiasis Mother   . Thyroid disease Mother   . Diabetes Maternal Grandmother   . Heart disease Maternal Grandmother     CVA  . Diabetes Paternal Grandmother   . Coronary artery disease Paternal Grandfather   . Cancer Cousin     Leukemia  . Hypertension  Father   . Diabetes Father     Allergies  Allergen Reactions  . Penicillins     REACTION: Rash    Current Outpatient Prescriptions on File Prior to Visit  Medication Sig Dispense Refill  . ibuprofen (ADVIL,MOTRIN) 200 MG tablet Take 200 mg by mouth every 6 (six) hours as needed.    . Pseudoephedrine-Acetaminophen (TYLENOL SINUS MAX ST PO) Take by mouth.    Marland Kitchen FLUoxetine (PROZAC) 20 MG tablet Take 1 tablet (20 mg total) by mouth daily. (Patient not taking: Reported on 03/27/2016) 90 tablet 2   No current facility-administered medications on file prior to visit.     BP 122/74   Pulse 84   Temp 98.7 F (37.1 C) (Oral)   Ht 5\' 8"  (1.727 m)   Wt 283 lb 12.8 oz (128.7 kg)   LMP 03/24/2016   SpO2  98%   BMI 43.15 kg/m    Objective:   Physical Exam  Constitutional: She appears well-nourished.  Neck: Neck supple.  Cardiovascular: Normal rate and regular rhythm.   Pulmonary/Chest: Effort normal and breath sounds normal.  Skin: Skin is warm and dry.  Psychiatric: She has a normal mood and affect.          Assessment & Plan:

## 2017-02-16 ENCOUNTER — Other Ambulatory Visit: Payer: Self-pay

## 2017-02-16 ENCOUNTER — Encounter: Payer: Self-pay | Admitting: Physician Assistant

## 2017-02-16 ENCOUNTER — Ambulatory Visit: Payer: PRIVATE HEALTH INSURANCE | Admitting: Physician Assistant

## 2017-02-16 VITALS — BP 116/80 | HR 79 | Temp 98.3°F | Resp 18 | Ht 68.0 in | Wt 286.2 lb

## 2017-02-16 DIAGNOSIS — R51 Headache: Secondary | ICD-10-CM | POA: Diagnosis not present

## 2017-02-16 DIAGNOSIS — M62838 Other muscle spasm: Secondary | ICD-10-CM

## 2017-02-16 DIAGNOSIS — R519 Headache, unspecified: Secondary | ICD-10-CM

## 2017-02-16 MED ORDER — DOXYCYCLINE HYCLATE 100 MG PO TABS: 100.0000 mg | ORAL_TABLET | Freq: Two times a day (BID) | ORAL | 0 refills | Status: AC

## 2017-02-16 MED ORDER — CYCLOBENZAPRINE HCL 5 MG PO TABS: 5.0000 mg | ORAL_TABLET | Freq: Three times a day (TID) | ORAL | 0 refills | Status: AC | PRN

## 2017-02-16 NOTE — Patient Instructions (Addendum)
Heat to the neck to help with spasms in the neck  Motrin (advil) 200mg  over the counter -- 4 pills every 8 hours    IF you received an x-ray today, you will receive an invoice from [facility]. Please contact [facility] at [phone] with questions or concerns regarding your invoice.   IF you received labwork today, you will receive an invoice from [facility]. Please contact [facility] at [phone] with questions or concerns regarding your invoice.   Our billing staff will not be able to assist you with questions regarding bills from these companies.  You will be contacted with the lab results as soon as they are available. The fastest way to get your results is to activate your My Chart account. Instructions are located on the last page of this paperwork. If you have not heard from [place] regarding the results in 2 weeks, please contact this office.

## 2017-02-16 NOTE — Progress Notes (Signed)
Samantha Ramos  MRN: [id number] DOB: October 22, 1990  PCP: Pleas Koch, NP  Chief Complaint  Patient presents with  . Headache    had a headache the past couple days and today is feeling light headed   . Depression    screening was a 9     Subjective:  Pt presents to clinic for headache today and for the last couple of days.  In the past she has had problems with headaches and migraines.  She has had some slight nausea and started to feel hot.  She has had some problems with constipation this week.  She has been eating normal for her.  No sick contacts known.  She did have the upper respiratory infection about 2-3 weeks ago.  The headache is in the right frontal area with right cervical pain/strain also there.  She works with the public as an Electronics engineer.     She has tried nothing for the headache today.  She took tylenol the other day at work without much help.  No injury to her neck that she is aware of. The headache comes and goes at random times during the day.  She seems to have 2 types of headaches one in the frontal area that is like pressure and the other in the back of her right neck.  She has problems with stress.  She over analyzes.  She puts extra stress on her self from work and personal situations.  She used to be on prozac for these symptoms but she has been off it for about a year.  She stopped because she was feeling better from her depression.  History is obtained by patient.  Review of Systems  HENT: Negative for congestion, postnasal drip, sinus pressure and sinus pain.   Eyes: Positive for photophobia (mild R>L).  Gastrointestinal: Positive for nausea.  Neurological: Positive for dizziness (rare - mild - not assoicated with chang ein position) and headaches. Negative for weakness and numbness.    Patient Active Problem List   Diagnosis Date Noted  . Preventative health care 02/16/2015  . Back pain, acute 04/17/2014  . VITAMIN D DEFICIENCY 03/19/2009  .  DYSPLASTIC NEVUS, BACK 06/13/2007  . Generalized anxiety disorder 05/10/2007  . Morbid obesity with BMI of 40.0-44.9, adult ([place]) 02/14/2007  . DEPRESSION 02/14/2007  . History of migraine 02/14/2007    No current outpatient medications on file prior to visit.   No current facility-administered medications on file prior to visit.     Allergies  Allergen Reactions  . Penicillins     REACTION: Rash    Past Medical History:  Diagnosis Date  . Allergy   . Anxiety   . Depression    Social History   Social History Narrative         Diet: irregular eating, gatorade and chips at lunch, some fruits, and occ veggies   Parents divorced when she was 14, got some counseling then         Works as Electronics engineer at [employer]      Social History   Tobacco Use  . Smoking status: Never Smoker  . Smokeless tobacco: Never Used  Substance Use Topics  . Alcohol use: Yes    Alcohol/week: 0.0 oz    Comment: occ  . Drug use: No   family history includes Cancer in her cousin; Cholelithiasis in her mother; Coronary artery disease in her paternal grandfather; Depression in her mother; Diabetes in her father, maternal grandmother, and paternal  grandmother; Heart disease in her maternal grandmother; Hyperlipidemia in her mother; Hypertension in her father and mother; Thyroid disease in her mother.     Objective:  BP 116/80   Pulse 79   Temp 98.3 F (36.8 C) (Oral)   Resp 18   Ht 5\' 8"  (1.727 m)   Wt 286 lb 3.2 oz (129.8 kg)   LMP 08/09/2016   SpO2 97%   BMI 43.52 kg/m  Body mass index is 43.52 kg/m.  Physical Exam  Constitutional: She is oriented to person, place, and time and well-developed, well-nourished, and in no distress.  HENT:  Head: Normocephalic and atraumatic.  Right Ear: Hearing, tympanic membrane, external ear and ear canal normal.  Left Ear: Hearing, tympanic membrane, external ear and ear canal normal.  Nose: Right sinus exhibits frontal sinus tenderness. Right  sinus exhibits no maxillary sinus tenderness. Left sinus exhibits no maxillary sinus tenderness and no frontal sinus tenderness.  Mouth/Throat: Uvula is midline, oropharynx is clear and moist and mucous membranes are normal.  Eyes: Conjunctivae, EOM and lids are normal. Pupils are equal, round, and reactive to light.  Neck: Normal range of motion.  Cardiovascular: Normal rate, regular rhythm and normal heart sounds.  No murmur heard. Pulmonary/Chest: Effort normal and breath sounds normal.  Musculoskeletal:       Cervical back: She exhibits spasm.       Back:  Neurological: She is alert and oriented to person, place, and time. She has normal motor skills, normal sensation, normal strength and normal reflexes. She displays facial symmetry. Gait normal. Gait normal.  Skin: Skin is warm and dry.  Psychiatric: Mood, memory, affect and judgment normal.  Vitals reviewed.   Assessment and Plan :  Nonintractable headache, unspecified chronicity pattern, unspecified headache type - Plan: doxycycline (VIBRA-TABS) 100 MG tablet - concern pt has 2 types of pain currently but neither of which sound like migraine.  I suspect sinus infection from cold about 2 weeks ago leading to headache and not feeling well with dizzy, slight nausea and then headache from the muscle spasm in her right cervical area.  We will treat sinus infection and muscle spasms as needed.  She will use motrin for the headache and warm compresses.  She works at [employer] she was encouraged to try massage for her neck.  Muscle spasm - Plan: cyclobenzaprine (FLEXERIL) 5 MG tablet  Windell Hummingbird PA-C  [facility] [place] 02/16/2017 2:25 PM

## 2017-10-17 ENCOUNTER — Encounter: Payer: Self-pay | Attending: Primary Care | Admitting: Registered"

## 2017-10-17 ENCOUNTER — Encounter: Payer: Self-pay | Admitting: Registered"

## 2017-10-17 DIAGNOSIS — E669 Obesity, unspecified: Secondary | ICD-10-CM

## 2017-10-17 DIAGNOSIS — Z713 Dietary counseling and surveillance: Secondary | ICD-10-CM | POA: Insufficient documentation

## 2017-10-17 NOTE — Progress Notes (Signed)
  Medical Nutrition Therapy:  Appt start time: 11:05 end time:  12:10.   Assessment:  Primary concerns today: Pt states she is being treated for PCOS.   Pt expectations: more knowledge on how to do better with proper nutrition  Pt states she is stressed due to work and job security. 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 Pt states she has 2 dogs. Pt states she typically sleeps 6-7 hours night; stays up late on her cellphone.  Pt states she knows where she struggles. 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 Pt states she has recently made some dietary changes, eating out less often since last week. Pt states she eats leftovers from the previous day for lunch. Pt states she forgets her water cup sometimes and then she doesn't drink a lot of water on that day. Pt states she likes zucchini, broccoli, asparagus, squash, and artichoke hearts. Pt states she really likes condiments.   Pt states she does not like to go out in public to do things; prefers to stay home to avoid being judged by others. Pt states she does not like going to the gym because other people may be there.   Pt states she enjoys playing golf and will get back into it soon.     Preferred Learning Style:   No preference indicated   Learning Readiness:   Ready  Change in progress   MEDICATIONS: See list;    DIETARY INTAKE:  Usual eating pattern includes 2 meals and 1-2 snacks per day.  Everyday foods include chicken, pasta, peanut butter crackers, apples, peanut butter, or fast food.  Avoided foods include peas.   24-hr recall:  B ( AM): typically skips; McDonald's-sausage biscuit + coffee  Snk ( AM): none  L ( PM): leftovers (meat + pasta + toast) or chili beans + nachos or Chicfila (chicken sandiwch + fries) Snk ( PM): apples + peanut butter or peanut butter crackers D ( PM): cereal or fast food or meat + pasta + toast Snk ( PM): none Beverages: coffee, Coke, ginger ale, water (when she has her cup with her),  alcoholic drink (occasionally)  Usual physical activity: ADL's  Estimated energy needs: 2000 calories 225 g carbohydrates 150 g protein 56 g fat  Progress Towards Goal(s):  In progress.   Nutritional Diagnosis:  NB-1.1 Food and nutrition-related knowledge deficit As related to nutrition related information.  As evidenced by pt verbalizes inaccurate information.    Intervention:  Nutrition education and counseling. Pt was educated and counseled on the benefits of eating 3 meals a day, metabolism, having balanced meals, My Plate, body positivity, diet culture, and the importance of physical activity. Pt was in agreement with goals listed.  Goals: - Aim to add in breakfast option. - Aim to increase physical activity with walking the dogs at least 5 days/week.  - Aim to increase fiber intake with non-starchy vegetables with lunch and dinner.   Teaching Method Utilized:  Visual Auditory Hands on  Handouts given during visit include:  My Plate  Barriers to learning/adherence to lifestyle change: none identified  Demonstrated degree of understanding via:  Teach Back   Monitoring/Evaluation:  Dietary intake, exercise, and body weight prn.

## 2017-10-17 NOTE — Patient Instructions (Signed)
-   Aim to add in breakfast option.  - Aim to increase physical activity with walking the dogs at least 5 days/week.   - Aim to increase fiber intake with non-starchy vegetables with lunch and dinner.

## 2018-02-01 LAB — PSA: PSA: NEGATIVE

## 2018-02-25 ENCOUNTER — Encounter: Payer: Self-pay | Admitting: Nurse Practitioner

## 2018-02-25 ENCOUNTER — Ambulatory Visit (INDEPENDENT_AMBULATORY_CARE_PROVIDER_SITE_OTHER): Payer: PRIVATE HEALTH INSURANCE | Admitting: Nurse Practitioner

## 2018-02-25 VITALS — BP 108/86 | HR 85 | Temp 98.8°F | Ht 68.0 in | Wt 286.0 lb

## 2018-02-25 DIAGNOSIS — J00 Acute nasopharyngitis [common cold]: Secondary | ICD-10-CM

## 2018-02-25 LAB — POC INFLUENZA A&B (BINAX/QUICKVUE)
INFLUENZA A, POC: NEGATIVE
INFLUENZA B, POC: NEGATIVE

## 2018-02-25 MED ORDER — OXYMETAZOLINE HCL 0.05 % NA SOLN: 1.0000 | Freq: Two times a day (BID) | NASAL | 0 refills | Status: DC

## 2018-02-25 MED ORDER — BENZOCAINE-MENTHOL 6-10 MG MT LOZG: 1.0000 | LOZENGE | OROMUCOSAL | 0 refills | Status: DC | PRN

## 2018-02-25 MED ORDER — PHENYLEPH-DOXYLAMINE-DM-APAP 5-6.25-10-325 MG/15ML PO LIQD: 1.0000 | ORAL | 0 refills | Status: DC | PRN
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
## 2018-02-25 NOTE — Patient Instructions (Signed)
URI Instructions: Flonase and Afrin use: apply 1spray of afrin in each nare, wait 5mins, then apply 2sprays of flonase in each nare. Use both nasal spray consecutively x 3days, then flonase only for at least 14days.  Encourage adequate oral hydration.  Use over-the-counter  "cold" medicines  such as "Tylenol cold" , "Advil cold",  "Mucinex" or" Mucinex D"  for cough and congestion.  Avoid decongestants if you have high blood pressure. Use" Delsym" or" Robitussin" cough syrup varietis for cough.  You can use plain "Tylenol" or "Advi"l for fever, chills and achyness.   "Common cold" symptoms are usually triggered by a virus.  The antibiotics are usually not necessary. On average, a" viral cold" illness would take 4-7 days to resolve. Please, make an appointment if you are not better or if you're worse.  

## 2018-03-02 ENCOUNTER — Emergency Department (HOSPITAL_BASED_OUTPATIENT_CLINIC_OR_DEPARTMENT_OTHER)
Admission: EM | Admit: 2018-03-02 | Discharge: 2018-03-02 | Disposition: A | Discharge status: Home / Self Care | Payer: PRIVATE HEALTH INSURANCE | Attending: Emergency Medicine | Admitting: Emergency Medicine

## 2018-03-02 ENCOUNTER — Encounter (HOSPITAL_BASED_OUTPATIENT_CLINIC_OR_DEPARTMENT_OTHER): Payer: Self-pay | Admitting: Emergency Medicine

## 2018-03-02 ENCOUNTER — Other Ambulatory Visit: Payer: Self-pay

## 2018-03-02 ENCOUNTER — Emergency Department (HOSPITAL_BASED_OUTPATIENT_CLINIC_OR_DEPARTMENT_OTHER): Payer: PRIVATE HEALTH INSURANCE

## 2018-03-02 DIAGNOSIS — H9203 Otalgia, bilateral: Secondary | ICD-10-CM | POA: Insufficient documentation

## 2018-03-02 DIAGNOSIS — R0981 Nasal congestion: Secondary | ICD-10-CM | POA: Insufficient documentation

## 2018-03-02 DIAGNOSIS — Z7984 Long term (current) use of oral hypoglycemic drugs: Secondary | ICD-10-CM | POA: Diagnosis not present

## 2018-03-02 DIAGNOSIS — J189 Pneumonia, unspecified organism: Secondary | ICD-10-CM

## 2018-03-02 DIAGNOSIS — E119 Type 2 diabetes mellitus without complications: Secondary | ICD-10-CM | POA: Diagnosis not present

## 2018-03-02 DIAGNOSIS — N938 Other specified abnormal uterine and vaginal bleeding: Secondary | ICD-10-CM | POA: Diagnosis not present

## 2018-03-02 DIAGNOSIS — J181 Lobar pneumonia, unspecified organism: Secondary | ICD-10-CM | POA: Insufficient documentation

## 2018-03-02 DIAGNOSIS — R05 Cough: Secondary | ICD-10-CM | POA: Insufficient documentation

## 2018-03-02 DIAGNOSIS — N939 Abnormal uterine and vaginal bleeding, unspecified: Secondary | ICD-10-CM | POA: Diagnosis present

## 2018-03-02 DIAGNOSIS — R070 Pain in throat: Secondary | ICD-10-CM | POA: Diagnosis not present

## 2018-03-02 DIAGNOSIS — J0100 Acute maxillary sinusitis, unspecified: Secondary | ICD-10-CM

## 2018-03-02 HISTORY — DX: Type 2 diabetes mellitus without complications: E11.9

## 2018-03-02 HISTORY — DX: Polycystic ovarian syndrome: E28.2

## 2018-03-02 LAB — BASIC METABOLIC PANEL
Anion gap: 8 (ref 5–15)
BUN: 7 mg/dL (ref 6–20)
CO2: 24 mmol/L (ref 22–32)
CREATININE: 0.87 mg/dL (ref 0.44–1.00)
Calcium: 8.7 mg/dL — ABNORMAL LOW (ref 8.9–10.3)
Chloride: 105 mmol/L (ref 98–111)
GFR calc Af Amer: >60 mL/min (ref >60)
GFR calc non Af Amer: >60 mL/min (ref >60)
Glucose, Bld: 90 mg/dL (ref 70–99)
Potassium: 3.7 mmol/L (ref 3.5–5.1)
SODIUM: 137 mmol/L (ref 135–145)

## 2018-03-02 LAB — CBC WITH DIFFERENTIAL/PLATELET
ABS IMMATURE GRANULOCYTES: 0.03 10*3/uL (ref 0.00–0.07)
Basophils Absolute: 0 10*3/uL (ref 0.0–0.1)
Basophils Relative: 0 %
Eosinophils Absolute: 0.2 10*3/uL (ref 0.0–0.5)
Eosinophils Relative: 2 %
HEMATOCRIT: 38.6 % (ref 36.0–46.0)
Hemoglobin: 11.9 g/dL — ABNORMAL LOW (ref 12.0–15.0)
Immature Granulocytes: 0 %
LYMPHS ABS: 1.1 10*3/uL (ref 0.7–4.0)
Lymphocytes Relative: 11 %
MCH: 26.5 pg (ref 26.0–34.0)
MCHC: 30.8 g/dL (ref 30.0–36.0)
MCV: 86 fL (ref 80.0–100.0)
MONOS PCT: 4 %
Monocytes Absolute: 0.5 10*3/uL (ref 0.1–1.0)
NEUTROS ABS: 8.5 10*3/uL — AB (ref 1.7–7.7)
Neutrophils Relative %: 83 %
Platelets: 272 10*3/uL (ref 150–400)
RBC: 4.49 MIL/uL (ref 3.87–5.11)
RDW: 13.5 % (ref 11.5–15.5)
WBC: 10.4 10*3/uL (ref 4.0–10.5)
nRBC: 0 % (ref 0.0–0.2)

## 2018-03-02 LAB — PREGNANCY, URINE: PREG TEST UR: NEGATIVE

## 2018-03-02 MED ORDER — LEVOFLOXACIN 500 MG PO TABS
500.0000 mg | ORAL_TABLET | Freq: Once | ORAL | Status: AC
  Administered 2018-03-02: 500 mg via ORAL
  Filled 2018-03-02: qty 1

## 2018-03-02 MED ORDER — LORATADINE 10 MG PO TABS: 10.0000 mg | ORAL_TABLET | Freq: Every day | ORAL | 0 refills | Status: DC

## 2018-03-02 MED ORDER — LEVOFLOXACIN 500 MG PO TABS: 500.0000 mg | ORAL_TABLET | Freq: Every day | ORAL | 0 refills | Status: DC

## 2018-03-02 NOTE — ED Notes (Signed)
Patient transported to X-ray 

## 2018-03-02 NOTE — Discharge Instructions (Addendum)
Follow-up closely with your OB/GYN.

## 2018-03-02 NOTE — ED Triage Notes (Signed)
Patient here with persistent vaginal bleeding x 2-3 weeks. Today she presents with severe abdominal cramping and URI sx. She has active history with PCOS, has seen her doctors and has just began metformin.

## 2018-03-02 NOTE — ED Provider Notes (Signed)
[facility] Provider Note   CSN: [id number] Arrival date & time: 03/02/18  0809    History   Chief Complaint Chief Complaint  Patient presents with  . Vaginal Bleeding  . URI    HPI Samantha Ramos is a 28 y.o. female.     HPI Patient presents with 2 days of nasal congestion, sinus pressure, pressure and her ears right greater than left, sore throat, voice changes, cough productive of green sputum.  No known fever or chills.  No new rashes.  No sick contacts.  Patient also states she has had 3 weeks of vaginal bleeding.  Using 5-6 pads daily.  Recently started on oral birth control by her gynecologist.  States she is had continual bleeding with passage of clots.  No lightheadedness or syncope. Past Medical History:  Diagnosis Date  . Allergy   . Anxiety   . Depression   . Diabetes mellitus without complication ([place])   . PCOS (polycystic ovarian syndrome)     Patient Active Problem List   Diagnosis Date Noted  . Preventative health care 02/16/2015  . Back pain, acute 04/17/2014  . VITAMIN D DEFICIENCY 03/19/2009  . DYSPLASTIC NEVUS, BACK 06/13/2007  . Generalized anxiety disorder 05/10/2007  . Morbid obesity with BMI of 40.0-44.9, adult ([place]) 02/14/2007  . DEPRESSION 02/14/2007  . History of migraine 02/14/2007    Past Surgical History:  Procedure Laterality Date  . DENTAL SURGERY       OB History   No obstetric history on file.      Home Medications    Prior to Admission medications   Medication Sig Start Date End Date Taking? Authorizing Provider  benzocaine-menthol (CHLORAEPTIC) 6-10 MG lozenge Take 1 lozenge by mouth every 2 (two) hours as needed for sore throat. 02/25/18  Yes Nche, Charlene Brooke, NP  fluticasone (FLONASE) 50 MCG/ACT nasal spray Place 2 sprays into both nostrils daily. 02/25/18  Yes Nche, Charlene Brooke, NP  metFORMIN (GLUCOPHAGE) 500 MG tablet Take by mouth 2 (two) times daily with a meal.   Yes [provider]  oxymetazoline (AFRIN NASAL SPRAY) 0.05 % nasal spray Place 1 spray into both nostrils 2 (two) times daily. Use only for 3days, then stop 02/25/18  Yes Nche, Charlene Brooke, NP  Phenyleph-Doxylamine-DM-APAP 5-6.25-10-325 MG/15ML LIQD Take 1 tablet by mouth every 4 (four) hours as needed. 02/25/18  Yes Nche, Charlene Brooke, NP  levofloxacin (LEVAQUIN) 500 MG tablet Take 1 tablet (500 mg total) by mouth daily. 03/03/18   Julianne Rice, MD  loratadine (CLARITIN) 10 MG tablet Take 1 tablet (10 mg total) by mouth daily. 03/02/18   Julianne Rice, MD  norethindrone (MICRONOR,CAMILA,ERRIN) 0.35 MG tablet Take 1 tablet by mouth daily.    [provider]    Family History Family History  Problem Relation Age of Onset  . Hypertension Mother   . Hyperlipidemia Mother   . Depression Mother   . Cholelithiasis Mother   . Thyroid disease Mother   . Hypertension Father   . Diabetes Father   . Diabetes Maternal Grandmother   . Heart disease Maternal Grandmother        CVA  . Diabetes Paternal Grandmother   . Coronary artery disease Paternal Grandfather   . Cancer Cousin        Leukemia    Social History Social History   Tobacco Use  . Smoking status: Never Smoker  . Smokeless tobacco: Never Used  Substance Use Topics  . Alcohol  use: Yes    Alcohol/week: 0.0 standard drinks    Comment: occ  . Drug use: No     Allergies   Penicillins   Review of Systems Review of Systems  Constitutional: Positive for fatigue. Negative for chills and fever.  HENT: Positive for congestion, rhinorrhea, sinus pain, sore throat and voice change. Negative for trouble swallowing.   Eyes: Negative for visual disturbance.  Respiratory: Positive for cough. Negative for shortness of breath.   Cardiovascular: Negative for chest pain.  Gastrointestinal: Negative for abdominal pain, constipation, diarrhea, nausea and vomiting.  Genitourinary: Positive for vaginal bleeding. Negative for  dysuria, flank pain and vaginal discharge.  Musculoskeletal: Negative for back pain, myalgias, neck pain and neck stiffness.  Skin: Negative for rash and wound.  Neurological: Negative for dizziness, weakness, light-headedness, numbness and headaches.  All other systems reviewed and are negative.    Physical Exam Updated Vital Signs BP 136/83 (BP Location: Left Arm)   Pulse (!) 104   Temp 98.7 F (37.1 C) (Oral)   Resp 20   Ht 5\' 8"  (1.727 m)   Wt 129.7 kg   LMP 03/02/2018 Comment: Patient has had period for over 3 weeks  SpO2 98%   BMI 43.49 kg/m   Physical Exam Vitals signs and nursing note reviewed.  Constitutional:      General: She is not in acute distress.    Appearance: Normal appearance. She is well-developed.  HENT:     Head: Normocephalic and atraumatic.     Comments: Bulging TMs bilaterally.  Bilateral nasal mucosal edema.  Right maxillary sinus tenderness to percussion.  Patient has erythematous oropharynx without exudates.  Uvula is midline.  Tolerating secretions.    Nose: Congestion present.     Mouth/Throat:     Mouth: Mucous membranes are moist.     Pharynx: Posterior oropharyngeal erythema present.  Eyes:     Extraocular Movements: Extraocular movements intact.     Pupils: Pupils are equal, round, and reactive to light.  Neck:     Musculoskeletal: Normal range of motion and neck supple. No neck rigidity or muscular tenderness.     Comments: No meningismus.  No cervical lymphadenopathy. Cardiovascular:     Rate and Rhythm: Normal rate and regular rhythm.  Pulmonary:     Effort: Pulmonary effort is normal.     Breath sounds: Rhonchi present.     Comments: Few scattered rhonchi.  No respiratory distress. Abdominal:     General: Bowel sounds are normal.     Palpations: Abdomen is soft.     Tenderness: There is no abdominal tenderness. There is no right CVA tenderness, left CVA tenderness, guarding or rebound.  Musculoskeletal: Normal range of motion.         General: No swelling, tenderness, deformity or signs of injury.     Right lower leg: No edema.     Left lower leg: No edema.  Lymphadenopathy:     Cervical: No cervical adenopathy.  Skin:    General: Skin is warm and dry.     Capillary Refill: Capillary refill takes less than 2 seconds.     Findings: No erythema or rash.  Neurological:     General: No focal deficit present.     Mental Status: She is alert and oriented to person, place, and time.  Psychiatric:        Behavior: Behavior normal.      ED Treatments / Results  Labs (all labs ordered are listed, but only abnormal  results are displayed) Labs Reviewed  CBC WITH DIFFERENTIAL/PLATELET - Abnormal; Notable for the following components:      Result Value   Hemoglobin 11.9 (*)    Neutro Abs 8.5 (*)    All other components within normal limits  BASIC METABOLIC PANEL - Abnormal; Notable for the following components:   Calcium 8.7 (*)    All other components within normal limits  PREGNANCY, URINE    EKG None  Radiology Dg Chest 2 View  Result Date: 03/02/2018 CLINICAL DATA:  Cough congestion and sore throat. EXAM: CHEST - 2 VIEW COMPARISON:  None. FINDINGS: Cardiomediastinal silhouette is normal. Mediastinal contours appear intact. There is no evidence of pleural effusion or pneumothorax. Mild peribronchial airspace consolidation in the left lower lobe. Osseous structures are without acute abnormality. Soft tissues are grossly normal. IMPRESSION: Mild peribronchial airspace consolidation in the left lower lobe may represent bronchitis or early bronchopneumonia. Electronically Signed   By: Fidela Salisbury M.D.   On: 03/02/2018 09:26    Procedures Procedures (including critical care time)  Medications Ordered in ED Medications - No data to display   Initial Impression / Assessment and Plan / ED Course  I have reviewed the triage vital signs and the nursing notes.  Pertinent labs & imaging results that were  available during my care of the patient were reviewed by me and considered in my medical decision making (see chart for details).       Evidence of pneumonia on chest x-ray.  Hemoglobin is stable.  Vital signs are stable as well.  Patient is being closely managed by her OB/GYN.  She is encouraged to follow-up early next week.  Strict return precautions have been given.   Final Clinical Impressions(s) / ED Diagnoses   Final diagnoses:  Community acquired pneumonia of left lower lobe of lung ([place])  Dysfunctional uterine bleeding  Acute maxillary sinusitis, recurrence not specified    ED Discharge Orders         Ordered    levofloxacin (LEVAQUIN) 500 MG tablet  Daily     03/02/18 1032    loratadine (CLARITIN) 10 MG tablet  Daily     03/02/18 1032           Julianne Rice, MD 03/02/18 [phone]

## 2018-06-05 ENCOUNTER — Encounter: Payer: Self-pay | Admitting: Primary Care

## 2018-06-05 ENCOUNTER — Ambulatory Visit (INDEPENDENT_AMBULATORY_CARE_PROVIDER_SITE_OTHER): Payer: PRIVATE HEALTH INSURANCE | Admitting: Primary Care

## 2018-06-05 DIAGNOSIS — F331 Major depressive disorder, recurrent, moderate: Secondary | ICD-10-CM

## 2018-06-05 DIAGNOSIS — F411 Generalized anxiety disorder: Secondary | ICD-10-CM

## 2018-06-05 MED ORDER — SERTRALINE HCL 25 MG PO TABS: 25.0000 mg | ORAL_TABLET | Freq: Every day | ORAL | 1 refills | Status: DC

## 2018-06-05 NOTE — Assessment & Plan Note (Signed)
Chronic and intermittent over the years, worse over the last several months since Covid-19 pandemic. GAD 7 score of 13 and PHQ 9 score of 10. Discussed options for treatment including therapy vs medication, she opts for both. Referral placed for therapy. Rx for Zoloft 25 mg sent to pharmacy, she didn't feel as though fluoxetine was effective in the past.  Patient is to take 1/2 tablet daily for 6 days, then advance to 1 full tablet thereafter. We discussed possible side effects of headache, GI upset, drowsiness, and SI/HI. If thoughts of SI/HI develop, we discussed to present to the emergency immediately. Patient verbalized understanding.   Follow up in 6 weeks for re-evaluation.

## 2018-06-05 NOTE — Progress Notes (Signed)
Subjective:    Patient ID: Samantha Ramos, female    DOB: 18-Mar-1990, 28 y.o.   MRN: [id number]  HPI  Virtual Visit via Video Note  I connected with Samantha Ramos on 06/05/18 at  8:20 AM EDT by a video enabled telemedicine application and verified that I am speaking with the correct person using two identifiers.  Location: Patient: Home Provider: Office   I discussed the limitations of evaluation and management by telemedicine and the availability of in person appointments. The patient expressed understanding and agreed to proceed.  History of Present Illness:  Ms. Dechellis is a 28 year old female with a history of depression, anxiety who presents today with a chief complaint of anxiety.  She is currently furloughed since the Covid-19 pandemic began in early March. 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 She's felt very on edge, nervous, is skeptical with opening. She has training tomorrow.  Over the last year she's experienced intermittent anxiety and depression, increased since Covid-19. She's doing some meditation, walking more which has helped with anxiety and depression. She was once managed on Fluoxetine which helped temporarily but felt as though it lost its effect within a few months. She was also seeing a therapist which was beneficial. GAD 7 score of 13 and PHQ 9 score of 10 today. She is requesting assistance with long term and recent increased symptoms.    Observations/Objective:  Alert and oriented. Appears well, not sickly. No distress. Speaking in complete sentences.   Assessment and Plan:  See problem based charting.  Follow Up Instructions:  Start Zoloft 25 mg tablets for anxiety and depression. Start by taking 1/2 tablet daily for 6 days then increase to 1 full tablet thereafter.  You will be contacted regarding your referral to therapy.  Please let us know if you have not been contacted within one week.   Schedule a follow up visit  for 6 weeks for re-evaluation.  It was a pleasure to see you today! Allie Bossier, NP-C    I discussed the assessment and treatment plan with the patient. The patient was provided an opportunity to ask questions and all were answered. The patient agreed with the plan and demonstrated an understanding of the instructions.   The patient was advised to call back or seek an in-person evaluation if the sym   Pleas Koch, NP    Review of Systems  Respiratory: Negative for shortness of breath.   Cardiovascular: Negative for chest pain.  Psychiatric/Behavioral: Positive for sleep disturbance. Negative for suicidal ideas. The patient is nervous/anxious.        See HPI       Past Medical History:  Diagnosis Date  . Allergy   . Anxiety   . Depression   . Diabetes mellitus without complication ([place])   . PCOS (polycystic ovarian syndrome)      Social History   Socioeconomic History  . Marital status: Single    Spouse name: Not on file  . Number of children: Not on file  . Years of education: Not on file  . Highest education level: Not on file  Occupational History  . Not on file  Social Needs  . Financial resource strain: Not on file  . Food insecurity:    Worry: Not on file    Inability: Not on file  . Transportation needs:    Medical: Not on file    Non-medical: Not on file  Tobacco  Use  . Smoking status: Never Smoker  . Smokeless tobacco: Never Used  Substance and Sexual Activity  . Alcohol use: Yes    Alcohol/week: 0.0 standard drinks    Comment: occ  . Drug use: No  . Sexual activity: Yes  Lifestyle  . Physical activity:    Days per week: Not on file    Minutes per session: Not on file  . Stress: Not on file  Relationships  . Social connections:    Talks on phone: Not on file    Gets together: Not on file    Attends religious service: Not on file    Active member of club or organization: Not on file    Attends meetings of clubs or organizations: Not on  file    Relationship status: Not on file  . Intimate partner violence:    Fear of current or ex partner: Not on file    Emotionally abused: Not on file    Physically abused: Not on file    Forced sexual activity: Not on file  Other Topics Concern  . Not on file  Social History Narrative         Diet: irregular eating, gatorade and chips at lunch, some fruits, and occ veggies   Parents divorced when she was 76, got some counseling then         Works as Electronics engineer at [employer]       Past Surgical History:  Procedure Laterality Date  . DENTAL SURGERY      Family History  Problem Relation Age of Onset  . Hypertension Mother   . Hyperlipidemia Mother   . Depression Mother   . Cholelithiasis Mother   . Thyroid disease Mother   . Hypertension Father   . Diabetes Father   . Diabetes Maternal Grandmother   . Heart disease Maternal Grandmother        CVA  . Diabetes Paternal Grandmother   . Coronary artery disease Paternal Grandfather   . Cancer Cousin        Leukemia    Allergies  Allergen Reactions  . Penicillins     REACTION: Rash    Current Outpatient Medications on File Prior to Visit  Medication Sig Dispense Refill  . metFORMIN (GLUCOPHAGE) 500 MG tablet Take 1,000 mg by mouth 2 (two) times daily with a meal.     . norethindrone (MICRONOR,CAMILA,ERRIN) 0.35 MG tablet Take 1 tablet by mouth daily.     No current facility-administered medications on file prior to visit.     Wt 274 lb (124.3 kg)   BMI 41.66 kg/m    Objective:   Physical Exam  Constitutional: She is oriented to person, place, and time. She appears well-nourished.  Respiratory: Effort normal.  Neurological: She is alert and oriented to person, place, and time.  Psychiatric: She has a normal mood and affect.           Assessment & Plan:

## 2018-06-05 NOTE — Addendum Note (Signed)
Addended by: Pleas Koch on: 06/05/2018 08:48 AM   Modules accepted: Orders

## 2018-06-05 NOTE — Patient Instructions (Signed)
Start Zoloft 25 mg tablets for anxiety and depression. Start by taking 1/2 tablet daily for 6 days then increase to 1 full tablet thereafter.  You will be contacted regarding your referral to therapy.  Please let us know if you have not been contacted within one week.   Schedule a follow up visit for 6 weeks for re-evaluation.  It was a pleasure to see you today! Allie Bossier, NP-C

## 2018-06-06 ENCOUNTER — Ambulatory Visit (INDEPENDENT_AMBULATORY_CARE_PROVIDER_SITE_OTHER): Payer: PRIVATE HEALTH INSURANCE | Admitting: Professional

## 2018-06-06 DIAGNOSIS — F411 Generalized anxiety disorder: Secondary | ICD-10-CM

## 2018-06-11 ENCOUNTER — Ambulatory Visit (INDEPENDENT_AMBULATORY_CARE_PROVIDER_SITE_OTHER): Payer: PRIVATE HEALTH INSURANCE | Admitting: Professional

## 2018-06-11 DIAGNOSIS — F411 Generalized anxiety disorder: Secondary | ICD-10-CM | POA: Diagnosis not present

## 2018-06-12 ENCOUNTER — Encounter: Payer: Self-pay | Admitting: Primary Care

## 2018-06-12 ENCOUNTER — Telehealth: Payer: Self-pay

## 2018-06-12 NOTE — Telephone Encounter (Addendum)
Pt last seen 06/05/18 and was started on zoloft 25 mg taking 1/2 daily for 1 wk then increase to 1 tab daily.pt last day to take 1/2 tab is 06/13/18.Pt is supposed to return to work on 06/13/18 and pt does not think she can return to work on 06/13/18. Pt had panic attack that lasted about 10 - 15 ' on 06/10/18.pt wants to know if she can get on the zoloft 25 mg for few days before goes back to work. Pt request note to be out of work until 06/18/18. Pt is already scheduled off work on 06/17/18.pt said anxiety is better because it is not constant but still has times when anxiety hits pt and it is never same time of day. Pt is seeing therapist and it seems to be helping. No SI/HI. Pt request cb today.

## 2018-06-12 NOTE — Telephone Encounter (Signed)
Please notify patient that it's okay to stay on the 25 mg (1/2 tablet) of Zoloft until she feels comfortable with increasing to 1 full tablet. I will also provide her with a note for work to return on June 9th. I will put this in Chan's inbox.

## 2018-06-13 NOTE — Telephone Encounter (Signed)
Spoken and notified patient of Samantha Ramos comments. Patient verbalized understanding.  I dont see the work note?

## 2018-06-13 NOTE — Telephone Encounter (Signed)
I put the work note up front. My apologies.

## 2018-06-17 ENCOUNTER — Ambulatory Visit (INDEPENDENT_AMBULATORY_CARE_PROVIDER_SITE_OTHER): Payer: PRIVATE HEALTH INSURANCE | Admitting: Professional

## 2018-06-17 DIAGNOSIS — F411 Generalized anxiety disorder: Secondary | ICD-10-CM

## 2018-06-25 ENCOUNTER — Ambulatory Visit (INDEPENDENT_AMBULATORY_CARE_PROVIDER_SITE_OTHER): Payer: PRIVATE HEALTH INSURANCE | Admitting: Professional

## 2018-06-25 DIAGNOSIS — F321 Major depressive disorder, single episode, moderate: Secondary | ICD-10-CM

## 2018-06-25 DIAGNOSIS — F411 Generalized anxiety disorder: Secondary | ICD-10-CM

## 2018-07-03 ENCOUNTER — Ambulatory Visit (INDEPENDENT_AMBULATORY_CARE_PROVIDER_SITE_OTHER): Payer: PRIVATE HEALTH INSURANCE | Admitting: Internal Medicine

## 2018-07-03 ENCOUNTER — Encounter: Payer: Self-pay | Admitting: Internal Medicine

## 2018-07-03 DIAGNOSIS — J301 Allergic rhinitis due to pollen: Secondary | ICD-10-CM | POA: Diagnosis not present

## 2018-07-03 NOTE — Progress Notes (Signed)
Virtual Visit via Video Note  I connected with Samantha Ramos on 07/03/18 at  3:45 PM EDT by a video enabled telemedicine application and verified that I am speaking with the correct person using two identifiers.  Location: Patient: Home Provider: Office   I discussed the limitations of evaluation and management by telemedicine and the availability of in person appointments. The patient expressed understanding and agreed to proceed.  History of Present Illness:  Pt reports sore throat, puffy, red eyes and ear fullness. This started yesterday. She denies visual changes, drainage from the eyes. She denies ear pain, drainage or decreased hearing. She denies difficulty swallowing. She denies runny nose, nasal congestion, cough or shortness of breath. She denies fever, chills and body aches. She has tried Benadryl and Tylenol with minimal relief. She has not had sick contacts. She does have a history of allergies.   Past Medical History:  Diagnosis Date  . Allergy   . Anxiety   . Depression   . Diabetes mellitus without complication ([place])   . PCOS (polycystic ovarian syndrome)     Current Outpatient Medications  Medication Sig Dispense Refill  . metFORMIN (GLUCOPHAGE) 500 MG tablet Take 1,000 mg by mouth 2 (two) times daily with a meal.     . norethindrone (MICRONOR,CAMILA,ERRIN) 0.35 MG tablet Take 1 tablet by mouth daily.    . sertraline (ZOLOFT) 25 MG tablet Take 1 tablet (25 mg total) by mouth daily. For anxiety and depression. 30 tablet 1   No current facility-administered medications for this visit.     Allergies  Allergen Reactions  . Penicillins     REACTION: Rash    Family History  Problem Relation Age of Onset  . Hypertension Mother   . Hyperlipidemia Mother   . Depression Mother   . Cholelithiasis Mother   . Thyroid disease Mother   . Hypertension Father   . Diabetes Father   . Diabetes Maternal Grandmother   . Heart disease Maternal Grandmother        CVA  .  Diabetes Paternal Grandmother   . Coronary artery disease Paternal Grandfather   . Cancer Cousin        Leukemia    Social History   Socioeconomic History  . Marital status: Single    Spouse name: Not on file  . Number of children: Not on file  . Years of education: Not on file  . Highest education level: Not on file  Occupational History  . Not on file  Social Needs  . Financial resource strain: Not on file  . Food insecurity    Worry: Not on file    Inability: Not on file  . Transportation needs    Medical: Not on file    Non-medical: Not on file  Tobacco Use  . Smoking status: Never Smoker  . Smokeless tobacco: Never Used  Substance and Sexual Activity  . Alcohol use: Yes    Alcohol/week: 0.0 standard drinks    Comment: occ  . Drug use: No  . Sexual activity: Yes  Lifestyle  . Physical activity    Days per week: Not on file    Minutes per session: Not on file  . Stress: Not on file  Relationships  . Social Herbalist on phone: Not on file    Gets together: Not on file    Attends religious service: Not on file    Active member of club or organization: Not on file  Attends meetings of clubs or organizations: Not on file    Relationship status: Not on file  . Intimate partner violence    Fear of current or ex partner: Not on file    Emotionally abused: Not on file    Physically abused: Not on file    Forced sexual activity: Not on file  Other Topics Concern  . Not on file  Social History Narrative         Diet: irregular eating, gatorade and chips at lunch, some fruits, and occ veggies   Parents divorced when she was 68, got some counseling then         Works as Electronics engineer at [employer]        Constitutional: Denies fever, malaise, fatigue, headache or abrupt weight changes.  HEENT: Pt reports sore throat, red, puffy eyes. Denies ear pain, ringing in the ears, wax buildup, runny nose, nasal congestion, bloody nose. Respiratory: Denies  difficulty breathing, shortness of breath, cough or sputum production.   Cardiovascular: Denies chest pain, chest tightness, palpitations or swelling in the hands or feet.   No other specific complaints in a complete review of systems (except as listed in HPI above).   Wt Readings from Last 3 Encounters:  06/05/18 274 lb (124.3 kg)  03/02/18 286 lb (129.7 kg)  02/25/18 286 lb (129.7 kg)    General: Appears her stated age, well developed, well nourished in NAD. Skin: Warm, dry and intact. No rashes noted. HEENT: Head: normal shape and size; Eyes: sclera white, and EOMs intact; Pulmonary/Chest: Normal effort. No respiratory distress.  Neurological: Alert and oriented.    BMET    Component Value Date/Time   NA 137 03/02/2018 0907   K 3.7 03/02/2018 0907   CL 105 03/02/2018 0907   CO2 24 03/02/2018 0907   GLUCOSE 90 03/02/2018 0907   BUN 7 03/02/2018 0907   CREATININE 0.87 03/02/2018 0907   CALCIUM 8.7 (L) 03/02/2018 0907   GFRNONAA >60 03/02/2018 0907   GFRAA >60 03/02/2018 0907    Lipid Panel     Component Value Date/Time   CHOL 127 02/16/2015 1145   TRIG 90.0 02/16/2015 1145   HDL 34.00 (L) 02/16/2015 1145   CHOLHDL 4 02/16/2015 1145   VLDL 18.0 02/16/2015 1145   LDLCALC 75 02/16/2015 1145    CBC    Component Value Date/Time   WBC 10.4 03/02/2018 0907   RBC 4.49 03/02/2018 0907   HGB 11.9 (L) 03/02/2018 0907   HCT 38.6 03/02/2018 0907   PLT 272 03/02/2018 0907   MCV 86.0 03/02/2018 0907   MCV 87.1 12/28/2012 1433   MCH 26.5 03/02/2018 0907   MCHC 30.8 03/02/2018 0907   RDW 13.5 03/02/2018 0907   LYMPHSABS 1.1 03/02/2018 0907   MONOABS 0.5 03/02/2018 0907   EOSABS 0.2 03/02/2018 0907   BASOSABS 0.0 03/02/2018 0907    Hgb A1C Lab Results  Component Value Date   HGBA1C 5.8 02/16/2015         Follow Up Instructions:  Allergic Rhinitis:  Start Allegra and Flonase OTC Take Ibuprofen instead of Tylenol as needed for sore throat No need for  COVID testing at this time  Return precautions discussed   I discussed the assessment and treatment plan with the patient. The patient was provided an opportunity to ask questions and all were answered. The patient agreed with the plan and demonstrated an understanding of the instructions.   The patient was advised to call back or seek  an in-person evaluation if the symptoms worsen or if the condition fails to improve as anticipated.    Webb Silversmith, NP

## 2018-07-04 ENCOUNTER — Ambulatory Visit (INDEPENDENT_AMBULATORY_CARE_PROVIDER_SITE_OTHER): Payer: PRIVATE HEALTH INSURANCE | Admitting: Professional

## 2018-07-04 ENCOUNTER — Encounter: Payer: Self-pay | Admitting: Internal Medicine

## 2018-07-04 DIAGNOSIS — F411 Generalized anxiety disorder: Secondary | ICD-10-CM

## 2018-07-04 NOTE — Patient Instructions (Signed)

## 2018-07-06 ENCOUNTER — Emergency Department (HOSPITAL_BASED_OUTPATIENT_CLINIC_OR_DEPARTMENT_OTHER): Payer: PRIVATE HEALTH INSURANCE

## 2018-07-06 ENCOUNTER — Other Ambulatory Visit: Payer: Self-pay

## 2018-07-06 ENCOUNTER — Encounter (HOSPITAL_BASED_OUTPATIENT_CLINIC_OR_DEPARTMENT_OTHER): Payer: Self-pay | Admitting: Adult Health

## 2018-07-06 ENCOUNTER — Emergency Department (HOSPITAL_BASED_OUTPATIENT_CLINIC_OR_DEPARTMENT_OTHER)
Admission: EM | Admit: 2018-07-06 | Discharge: 2018-07-06 | Disposition: A | Discharge status: Home / Self Care | Payer: PRIVATE HEALTH INSURANCE | Attending: Emergency Medicine | Admitting: Emergency Medicine

## 2018-07-06 DIAGNOSIS — R070 Pain in throat: Secondary | ICD-10-CM | POA: Diagnosis present

## 2018-07-06 DIAGNOSIS — E119 Type 2 diabetes mellitus without complications: Secondary | ICD-10-CM | POA: Insufficient documentation

## 2018-07-06 DIAGNOSIS — B349 Viral infection, unspecified: Secondary | ICD-10-CM | POA: Diagnosis not present

## 2018-07-06 DIAGNOSIS — Z7984 Long term (current) use of oral hypoglycemic drugs: Secondary | ICD-10-CM | POA: Insufficient documentation

## 2018-07-06 DIAGNOSIS — Z20828 Contact with and (suspected) exposure to other viral communicable diseases: Secondary | ICD-10-CM | POA: Diagnosis not present

## 2018-07-06 DIAGNOSIS — Z79899 Other long term (current) drug therapy: Secondary | ICD-10-CM | POA: Diagnosis not present

## 2018-07-06 LAB — GROUP A STREP BY PCR: Group A Strep by PCR: NOT DETECTED

## 2018-07-06 MED ORDER — DEXAMETHASONE 6 MG PO TABS
10.0000 mg | ORAL_TABLET | Freq: Once | ORAL | Status: AC
  Administered 2018-07-06: 10 mg via ORAL
  Filled 2018-07-06: qty 1

## 2018-07-06 NOTE — ED Triage Notes (Signed)
PResents with laryngitis and sore throat that began 5 days ago associated with redness to the throat, fatigue and chills. Denies fevers.

## 2018-07-06 NOTE — ED Provider Notes (Signed)
[facility] Provider Note   CSN: [id number] Arrival date & time: 07/06/18  1529    History   Chief Complaint Chief Complaint  Patient presents with  . Sore Throat    HPI Samantha Ramos is a 28 y.o. female with history of diabetes, anxiety, depression, PCOS who presents with a 5-day history of sore throat, congestion, laryngitis and 1 day history of cough.  Patient has had subjective hot flashes and chills.  Patient reports some dry cough with occasional mucus.  She denies any abdominal pain, nausea, vomiting, diarrhea.  She has been taking Allegra at home for suspected allergies.  She has history of pneumonia and is concerned that her illness may turn into pneumonia.  Patient states she has been feeling somewhat improved over the past day or so.     HPI  Past Medical History:  Diagnosis Date  . Allergy   . Anxiety   . Depression   . Diabetes mellitus without complication ([place])   . PCOS (polycystic ovarian syndrome)     Patient Active Problem List   Diagnosis Date Noted  . Preventative health care 02/16/2015  . Back pain, acute 04/17/2014  . VITAMIN D DEFICIENCY 03/19/2009  . DYSPLASTIC NEVUS, BACK 06/13/2007  . Generalized anxiety disorder 05/10/2007  . Morbid obesity with BMI of 40.0-44.9, adult ([place]) 02/14/2007  . MDD (major depressive disorder) 02/14/2007  . History of migraine 02/14/2007    Past Surgical History:  Procedure Laterality Date  . DENTAL SURGERY       OB History   No obstetric history on file.      Home Medications    Prior to Admission medications   Medication Sig Start Date End Date Taking? Authorizing Provider  metFORMIN (GLUCOPHAGE) 500 MG tablet Take 1,000 mg by mouth 2 (two) times daily with a meal.     [provider]  norethindrone (MICRONOR,CAMILA,ERRIN) 0.35 MG tablet Take 1 tablet by mouth daily.    [provider]  sertraline (ZOLOFT) 25 MG tablet Take 1 tablet (25 mg total) by  mouth daily. For anxiety and depression. 06/05/18   Pleas Koch, NP    Family History Family History  Problem Relation Age of Onset  . Hypertension Mother   . Hyperlipidemia Mother   . Depression Mother   . Cholelithiasis Mother   . Thyroid disease Mother   . Hypertension Father   . Diabetes Father   . Diabetes Maternal Grandmother   . Heart disease Maternal Grandmother        CVA  . Diabetes Paternal Grandmother   . Coronary artery disease Paternal Grandfather   . Cancer Cousin        Leukemia    Social History Social History   Tobacco Use  . Smoking status: Never Smoker  . Smokeless tobacco: Never Used  Substance Use Topics  . Alcohol use: Yes    Alcohol/week: 0.0 standard drinks    Comment: occ  . Drug use: No     Allergies   Penicillins   Review of Systems Review of Systems  Constitutional: Positive for chills and fever.  HENT: Positive for congestion and sore throat.   Respiratory: Positive for cough.   Cardiovascular: Negative for chest pain.  Gastrointestinal: Negative for abdominal pain, diarrhea, nausea and vomiting.     Physical Exam Updated Vital Signs BP (!) 148/108   Pulse 88   Temp 98.5 F (36.9 C) (Oral)   Resp 18   Ht 5\' 9"  (  1.753 m)   Wt 123.8 kg   SpO2 100%   BMI 40.32 kg/m   Physical Exam Vitals signs and nursing note reviewed.  Constitutional:      General: She is not in acute distress.    Appearance: She is well-developed. She is not diaphoretic.  HENT:     Head: Normocephalic and atraumatic.     Right Ear: Tympanic membrane normal.     Left Ear: Tympanic membrane normal.     Mouth/Throat:     Pharynx: Posterior oropharyngeal erythema present. No oropharyngeal exudate.     Tonsils: No tonsillar exudate or tonsillar abscesses. 1+ on the right. 1+ on the left.     Comments: Hoarse voice Eyes:     General: No scleral icterus.       Right eye: No discharge.        Left eye: No discharge.     Conjunctiva/sclera:  Conjunctivae normal.     Pupils: Pupils are equal, round, and reactive to light.  Neck:     Musculoskeletal: Normal range of motion and neck supple.     Thyroid: No thyromegaly.  Cardiovascular:     Rate and Rhythm: Normal rate and regular rhythm.     Heart sounds: Normal heart sounds. No murmur. No friction rub. No gallop.   Pulmonary:     Effort: Pulmonary effort is normal. No respiratory distress.     Breath sounds: Normal breath sounds. No stridor. No wheezing or rales.  Abdominal:     General: Bowel sounds are normal. There is no distension.     Palpations: Abdomen is soft.     Tenderness: There is no abdominal tenderness. There is no guarding or rebound.  Lymphadenopathy:     Cervical: No cervical adenopathy.  Skin:    General: Skin is warm and dry.     Coloration: Skin is not pale.     Findings: No rash.  Neurological:     Mental Status: She is alert.     Coordination: Coordination normal.      ED Treatments / Results  Labs (all labs ordered are listed, but only abnormal results are displayed) Labs Reviewed  GROUP A STREP BY PCR  NOVEL CORONAVIRUS, NAA (HOSPITAL ORDER, SEND-OUT TO REF LAB)    EKG None  Radiology Dg Chest Portable 1 View  Result Date: 07/06/2018 CLINICAL DATA:  Cough and chills EXAM: PORTABLE CHEST 1 VIEW COMPARISON:  December 31, 2018 FINDINGS: No edema or consolidation. Heart size and pulmonary vascularity are normal. No adenopathy. No bone lesions. IMPRESSION: No edema or consolidation. Electronically Signed   By: Lowella Grip III M.D.   On: 07/06/2018 17:14    Procedures Procedures (including critical care time)  Medications Ordered in ED Medications  dexamethasone (DECADRON) tablet 10 mg (10 mg Oral Given 07/06/18 1723)     Initial Impression / Assessment and Plan / ED Course  I have reviewed the triage vital signs and the nursing notes.  Pertinent labs & imaging results that were available during my care of the patient were  reviewed by me and considered in my medical decision making (see chart for details).        Patient presenting with subjective chills, sore throat, laryngitis, cough.  Chest x-ray is clear.  Strep PCR is negative.  COVID 19 send out pending.  Patient given single dose of Decadron for sore throat and laryngitis.  Advised Mucinex and Allegra as prescribed over-the-counter.  Home isolation discussed.  Return precautions discussed.  Patient understands and agrees with plan.  Patient vital stable and discharged in satisfactory condition.  Samantha Ramos was evaluated in Emergency Department on 07/06/2018 for the symptoms described in the history of present illness. She was evaluated in the context of the global COVID-19 pandemic, which necessitated consideration that the patient might be at risk for infection with the SARS-CoV-2 virus that causes COVID-19. Institutional protocols and algorithms that pertain to the evaluation of patients at risk for COVID-19 are in a state of rapid change based on information released by regulatory bodies including the CDC and federal and state organizations. These policies and algorithms were followed during the patient's care in the ED.   Final Clinical Impressions(s) / ED Diagnoses   Final diagnoses:  Viral syndrome    ED Discharge Orders    None       Frederica Kuster, PA-C 07/06/18 1808    Malvin Johns, MD 07/06/18 [phone]

## 2018-07-06 NOTE — Discharge Instructions (Addendum)
Recommend taking Mucinex DM as prescribed at the counter, as needed for your cough.  Continue taking Allegra as prescribed over-the-counter.  Please return the emergency department you develop any new or worsening symptoms including severe shortness of breath.

## 2018-07-06 NOTE — ED Notes (Signed)
Patient transported to Ultrasound 

## 2018-07-08 LAB — NOVEL CORONAVIRUS, NAA (HOSP ORDER, SEND-OUT TO REF LAB; TAT 18-24 HRS): SARS-CoV-2, NAA: NOT DETECTED

## 2018-07-09 ENCOUNTER — Ambulatory Visit (INDEPENDENT_AMBULATORY_CARE_PROVIDER_SITE_OTHER): Payer: PRIVATE HEALTH INSURANCE | Admitting: Professional

## 2018-07-09 DIAGNOSIS — F411 Generalized anxiety disorder: Secondary | ICD-10-CM

## 2018-07-15 ENCOUNTER — Telehealth: Payer: Self-pay | Admitting: Primary Care

## 2018-07-15 NOTE — Telephone Encounter (Signed)
Noted, will evaluate and discuss.

## 2018-07-15 NOTE — Telephone Encounter (Signed)
Pt called to advise she is not feeling better from 6/24. She is still having throat irritation, cough and she feels like she has a clogged ear. She wants to know if there is anything else that can be advised. Also, she wants to know when she can return to work. Pt moved 7/8 appt to 7/7 for follow up and to discuss.

## 2018-07-16 ENCOUNTER — Ambulatory Visit (INDEPENDENT_AMBULATORY_CARE_PROVIDER_SITE_OTHER): Payer: PRIVATE HEALTH INSURANCE | Admitting: Primary Care

## 2018-07-16 ENCOUNTER — Encounter: Payer: Self-pay | Admitting: Primary Care

## 2018-07-16 ENCOUNTER — Ambulatory Visit (INDEPENDENT_AMBULATORY_CARE_PROVIDER_SITE_OTHER): Payer: PRIVATE HEALTH INSURANCE | Admitting: Professional

## 2018-07-16 ENCOUNTER — Telehealth: Payer: Self-pay

## 2018-07-16 ENCOUNTER — Other Ambulatory Visit: Payer: Self-pay

## 2018-07-16 DIAGNOSIS — F331 Major depressive disorder, recurrent, moderate: Secondary | ICD-10-CM | POA: Diagnosis not present

## 2018-07-16 DIAGNOSIS — J309 Allergic rhinitis, unspecified: Secondary | ICD-10-CM | POA: Diagnosis not present

## 2018-07-16 DIAGNOSIS — F411 Generalized anxiety disorder: Secondary | ICD-10-CM

## 2018-07-16 MED ORDER — SERTRALINE HCL 50 MG PO TABS: 50.0000 mg | ORAL_TABLET | Freq: Every day | ORAL | 0 refills | Status: DC

## 2018-07-16 NOTE — Assessment & Plan Note (Signed)
Anxiety symptoms seem improved with Zoloft, does still seem to struggle with depression.  Given that she's noticed some improvement we can conclude that Zoloft is effective on some level. Start with an increase in her dose to 50 mg. Continue with therapy.  Follow up in 1 month for evaluation. Denies SI/HI.

## 2018-07-16 NOTE — Patient Instructions (Signed)
Stop taking Mucinex.  Start taking Zyrtec for allergies. Continue using Flonase.  You can take Delsym as needed for cough. Get the plain, generic Delsym.  We've increased your dose of Zoloft to 50 mg. I sent a new prescription to your pharmacy.  Schedule a follow up visit for one month for re-evaluation.  It was a pleasure to see you today!

## 2018-07-16 NOTE — Telephone Encounter (Signed)
[facility] Night - Client Nonclinical Telephone Record AccessNurse Client [facility] Night - Client Client Site [place] Physician Alma Friendly - NP Contact Type Call Who Is Calling Patient / Member / Family / Caregiver Caller Name [place] Phone Number [phone] Patient Name Samantha Ramos Patient DOB 03-19-1990 Call Type Message Only Information Provided Reason for Call Request for General Office Information Initial Comment Caller states she is calling about a video appointment. Additional Comment Call Closed By: Lezlie Octave Transaction Date/Time: 07/16/2018 7:57:45 AM (ET)

## 2018-07-16 NOTE — Assessment & Plan Note (Signed)
Suspect symptoms are continued allergy involvement. She doesn't appear sickly/ill. Discussed to stop Mucinex. Switch to Zyrtec. Continue Flonase. Delsym PRN.  She will update. She did test negative for Covid recently.

## 2018-07-16 NOTE — Progress Notes (Signed)
Subjective:    Patient ID: Samantha Ramos, female    DOB: Nov 03, 1990, 28 y.o.   MRN: [id number]  HPI  Virtual Visit via Video Note  I connected with Samantha Ramos on 07/16/18 at  8:00 AM EDT by a video enabled telemedicine application and verified that I am speaking with the correct person using two identifiers.  Location: Patient: Home Provider: Office   I discussed the limitations of evaluation and management by telemedicine and the availability of in person appointments. The patient expressed understanding and agreed to proceed.  History of Present Illness:  Ms. Versteeg is a 28 year old female with a history of anxiety and depression who presents today with a chief complaint of ear fullness and cough. She is also due for follow up of anxiety and depression.  She was initially evaluated on 07/03/18 per [facility] with complaints of sore throat, puffy/red eyes, ear fullness that began one day prior. Symptoms were suspected to be secondary to allergic rhinitis so she was advised to start Flonase and Allegra.  She was then evaluated at [facility] on 07/06/18 with same symptoms. Also reported cough, congestion, laryngitis, chills. She had been compliant to her Allegra but was worried that she may have pneumonia. During her stay she underwent chest xray which was negative. She was tested for Covid-19 which resulted later as negative. She was treated for her symptoms with Decadron. She was advised to add Mucinex to Allegra.   Since her last two evaluations she's feeling slightly better. Symptoms include right ear fullness, mild cough, and throat irritation. She's tried using "ear relief" from [facility], Mucinex, and Allegra without resolve.   Since her last visit with me she's noticed some improvement in Zoloft. Positive effects include less negative thoughts, feeling less anxious. She has been following with therapy that has been helpful. She continues to lack motivation to do anything including home  chores. She denies SI/HI.    Observations/Objective:  Alert and oriented. Appears well, not sickly. No distress. Speaking in complete sentences.   Assessment and Plan:  See problem based charting  Follow Up Instructions:  Stop taking Mucinex.  Start taking Zyrtec for allergies. Continue using Flonase.  You can take Delsym as needed for cough. Get the plain, generic Delsym.  We've increased your dose of Zoloft to 50 mg. I sent a new prescription to your pharmacy.  Schedule a follow up visit for one month for re-evaluation.  It was a pleasure to see you today!    I discussed the assessment and treatment plan with the patient. The patient was provided an opportunity to ask questions and all were answered. The patient agreed with the plan and demonstrated an understanding of the instructions.   The patient was advised to call back or seek an in-person evaluation if the symptoms worsen or if the condition fails to improve as anticipated.    Pleas Koch, NP    Review of Systems  Constitutional: Negative for chills, fatigue and fever.  HENT: Positive for congestion. Negative for postnasal drip and sore throat.   Respiratory: Positive for cough. Negative for shortness of breath.   Cardiovascular: Negative for chest pain.  Allergic/Immunologic: Positive for environmental allergies.  Psychiatric/Behavioral:       See HPI       Past Medical History:  Diagnosis Date  . Allergy   . Anxiety   . Depression   . Diabetes mellitus without complication ([place])   . PCOS (polycystic ovarian syndrome)  Social History   Socioeconomic History  . Marital status: Single    Spouse name: Not on file  . Number of children: Not on file  . Years of education: Not on file  . Highest education level: Not on file  Occupational History  . Not on file  Social Needs  . Financial resource strain: Not on file  . Food insecurity    Worry: Not on file    Inability: Not on  file  . Transportation needs    Medical: Not on file    Non-medical: Not on file  Tobacco Use  . Smoking status: Never Smoker  . Smokeless tobacco: Never Used  Substance and Sexual Activity  . Alcohol use: Yes    Alcohol/week: 0.0 standard drinks    Comment: occ  . Drug use: No  . Sexual activity: Yes  Lifestyle  . Physical activity    Days per week: Not on file    Minutes per session: Not on file  . Stress: Not on file  Relationships  . Social Herbalist on phone: Not on file    Gets together: Not on file    Attends religious service: Not on file    Active member of club or organization: Not on file    Attends meetings of clubs or organizations: Not on file    Relationship status: Not on file  . Intimate partner violence    Fear of current or ex partner: Not on file    Emotionally abused: Not on file    Physically abused: Not on file    Forced sexual activity: Not on file  Other Topics Concern  . Not on file  Social History Narrative         Diet: irregular eating, gatorade and chips at lunch, some fruits, and occ veggies   Parents divorced when she was 14, got some counseling then         Works as Electronics engineer at [employer]       Past Surgical History:  Procedure Laterality Date  . DENTAL SURGERY      Family History  Problem Relation Age of Onset  . Hypertension Mother   . Hyperlipidemia Mother   . Depression Mother   . Cholelithiasis Mother   . Thyroid disease Mother   . Hypertension Father   . Diabetes Father   . Diabetes Maternal Grandmother   . Heart disease Maternal Grandmother        CVA  . Diabetes Paternal Grandmother   . Coronary artery disease Paternal Grandfather   . Cancer Cousin        Leukemia    Allergies  Allergen Reactions  . Penicillins     REACTION: Rash    Current Outpatient Medications on File Prior to Visit  Medication Sig Dispense Refill  . metFORMIN (GLUCOPHAGE) 500 MG tablet Take 1,000 mg by mouth 2 (two)  times daily with a meal.     . norethindrone (MICRONOR,CAMILA,ERRIN) 0.35 MG tablet Take 1 tablet by mouth daily.     No current facility-administered medications on file prior to visit.     There were no vitals taken for this visit.   Objective:   Physical Exam  Constitutional: She is oriented to person, place, and time. She appears well-nourished.  Respiratory: Effort normal.  Mildly, infrequent congested cough during visit  Neurological: She is alert and oriented to person, place, and time.  Psychiatric: She has a normal mood and affect.  Assessment & Plan:

## 2018-07-17 ENCOUNTER — Ambulatory Visit: Payer: PRIVATE HEALTH INSURANCE | Admitting: Primary Care

## 2018-07-26 ENCOUNTER — Ambulatory Visit (INDEPENDENT_AMBULATORY_CARE_PROVIDER_SITE_OTHER): Payer: PRIVATE HEALTH INSURANCE | Admitting: Professional

## 2018-07-26 DIAGNOSIS — F411 Generalized anxiety disorder: Secondary | ICD-10-CM

## 2018-08-05 ENCOUNTER — Ambulatory Visit: Payer: PRIVATE HEALTH INSURANCE | Admitting: Professional

## 2018-08-08 ENCOUNTER — Ambulatory Visit (INDEPENDENT_AMBULATORY_CARE_PROVIDER_SITE_OTHER): Payer: PRIVATE HEALTH INSURANCE | Admitting: Professional

## 2018-08-08 DIAGNOSIS — F411 Generalized anxiety disorder: Secondary | ICD-10-CM | POA: Diagnosis not present

## 2018-08-16 ENCOUNTER — Ambulatory Visit (INDEPENDENT_AMBULATORY_CARE_PROVIDER_SITE_OTHER): Payer: PRIVATE HEALTH INSURANCE | Admitting: Professional

## 2018-08-16 DIAGNOSIS — F411 Generalized anxiety disorder: Secondary | ICD-10-CM | POA: Diagnosis not present

## 2018-08-16 NOTE — Telephone Encounter (Signed)
Pt had visit on 07/16/18.

## 2018-08-20 ENCOUNTER — Encounter: Payer: Self-pay | Admitting: Primary Care

## 2018-08-20 ENCOUNTER — Ambulatory Visit (INDEPENDENT_AMBULATORY_CARE_PROVIDER_SITE_OTHER): Payer: PRIVATE HEALTH INSURANCE | Admitting: Primary Care

## 2018-08-20 ENCOUNTER — Other Ambulatory Visit: Payer: Self-pay

## 2018-08-20 DIAGNOSIS — F411 Generalized anxiety disorder: Secondary | ICD-10-CM | POA: Diagnosis not present

## 2018-08-20 DIAGNOSIS — F331 Major depressive disorder, recurrent, moderate: Secondary | ICD-10-CM | POA: Diagnosis not present

## 2018-08-20 MED ORDER — SERTRALINE HCL 50 MG PO TABS: 50.0000 mg | ORAL_TABLET | Freq: Every day | ORAL | 1 refills | Status: DC

## 2018-08-20 NOTE — Patient Instructions (Signed)
Continue taking Zoloft 50 mg daily for anxiety and depression.  It was a pleasure to see you today! Allie Bossier, NP-C

## 2018-08-20 NOTE — Assessment & Plan Note (Signed)
Improved with increased dose of Zoloft 50 mg. No side effects from increased dose of Zoloft to 50 mg. Continue Zoloft 50 mg, continue therapy. Follow up as needed. Refills sent to pharmacy.

## 2018-08-20 NOTE — Progress Notes (Signed)
Subjective:    Patient ID: Samantha Ramos, female    DOB: [birth year], 28 y.o.   MRN: [id number]  HPI  Virtual Visit via Video Note  I connected with Samantha Ramos on 08/20/18 at  8:40 AM EDT by a video enabled telemedicine application and verified that I am speaking with the correct person using two identifiers.  Location: Patient: Home Provider: Office   I discussed the limitations of evaluation and management by telemedicine and the availability of in person appointments. The patient expressed understanding and agreed to proceed.  History of Present Illness:  Samantha Ramos is a 28 year old female who presents today for follow up of anxiety and depression.  She was last evaluated on 07/16/2018 for follow up of anxiety and depression, had some improvement in symptoms since beginning Zoloft 25 mg, also seemed to be doing with therapy, but continued to experience lack of motivation and some anxiety. Given improvement in symptoms with potential for continued improvement we increased her dose of Zoloft to 50 mg.  Since her last visit she's doing much better. She has more motivation to do household chores, less anxiety overall, little to no depressive thoughts, is able to better handle stressful situations. She continues to meet with her therapist and feels that this is going well. Denies SI/HI.   Observations/Objective:  Alert and oriented. Appears well, not sickly. No distress. Speaking in complete sentences.   Assessment and Plan:  Improved with increased dose of Zoloft 50 mg. No side effects from increased dose of Zoloft to 50 mg. Continue Zoloft 50 mg, continue therapy. Follow up as needed. Refills sent to pharmacy.  Follow Up Instructions:  Continue taking Zoloft 50 mg daily for anxiety and depression.  It was a pleasure to see you today! Allie Bossier, NP-C    I discussed the assessment and treatment plan with the patient. The patient was provided an opportunity to ask  questions and all were answered. The patient agreed with the plan and demonstrated an understanding of the instructions.   The patient was advised to call back or seek an in-person evaluation if the symptoms worsen or if the condition fails to improve as anticipated.     Pleas Koch, NP    Review of Systems  Gastrointestinal: Negative for nausea.  Neurological: Negative for headaches.  Psychiatric/Behavioral:       See HPI       Past Medical History:  Diagnosis Date  . Allergy   . Anxiety   . Depression   . Diabetes mellitus without complication ([place])   . PCOS (polycystic ovarian syndrome)      Social History   Socioeconomic History  . Marital status: Single    Spouse name: Not on file  . Number of children: Not on file  . Years of education: Not on file  . Highest education level: Not on file  Occupational History  . Not on file  Social Needs  . Financial resource strain: Not on file  . Food insecurity    Worry: Not on file    Inability: Not on file  . Transportation needs    Medical: Not on file    Non-medical: Not on file  Tobacco Use  . Smoking status: Never Smoker  . Smokeless tobacco: Never Used  Substance and Sexual Activity  . Alcohol use: Yes    Alcohol/week: 0.0 standard drinks    Comment: occ  . Drug use: No  . Sexual activity: Yes  Lifestyle  .  Physical activity    Days per week: Not on file    Minutes per session: Not on file  . Stress: Not on file  Relationships  . Social Herbalist on phone: Not on file    Gets together: Not on file    Attends religious service: Not on file    Active member of club or organization: Not on file    Attends meetings of clubs or organizations: Not on file    Relationship status: Not on file  . Intimate partner violence    Fear of current or ex partner: Not on file    Emotionally abused: Not on file    Physically abused: Not on file    Forced sexual activity: Not on file  Other Topics  Concern  . Not on file  Social History Narrative         Diet: irregular eating, gatorade and chips at lunch, some fruits, and occ veggies   Parents divorced when she was 25, got some counseling then         Works as Electronics engineer at [employer]       Past Surgical History:  Procedure Laterality Date  . DENTAL SURGERY      Family History  Problem Relation Age of Onset  . Hypertension Mother   . Hyperlipidemia Mother   . Depression Mother   . Cholelithiasis Mother   . Thyroid disease Mother   . Hypertension Father   . Diabetes Father   . Diabetes Maternal Grandmother   . Heart disease Maternal Grandmother        CVA  . Diabetes Paternal Grandmother   . Coronary artery disease Paternal Grandfather   . Cancer Cousin        Leukemia    Allergies  Allergen Reactions  . Penicillins     REACTION: Rash    Current Outpatient Medications on File Prior to Visit  Medication Sig Dispense Refill  . metFORMIN (GLUCOPHAGE) 500 MG tablet Take 1,000 mg by mouth 2 (two) times daily with a meal.     . norethindrone (MICRONOR,CAMILA,ERRIN) 0.35 MG tablet Take 1 tablet by mouth daily.     No current facility-administered medications on file prior to visit.     Wt 274 lb (124.3 kg)   BMI 40.46 kg/m    Objective:   Physical Exam  Constitutional: She is oriented to person, place, and time. She appears well-nourished.  Respiratory: Effort normal.  Neurological: She is alert and oriented to person, place, and time.  Psychiatric: She has a normal mood and affect.           Assessment & Plan:

## 2018-08-30 ENCOUNTER — Ambulatory Visit (INDEPENDENT_AMBULATORY_CARE_PROVIDER_SITE_OTHER): Payer: PRIVATE HEALTH INSURANCE | Admitting: Professional

## 2018-08-30 DIAGNOSIS — F411 Generalized anxiety disorder: Secondary | ICD-10-CM | POA: Diagnosis not present

## 2018-09-11 ENCOUNTER — Ambulatory Visit (INDEPENDENT_AMBULATORY_CARE_PROVIDER_SITE_OTHER): Payer: PRIVATE HEALTH INSURANCE | Admitting: Professional

## 2018-09-11 DIAGNOSIS — F411 Generalized anxiety disorder: Secondary | ICD-10-CM

## 2018-09-26 ENCOUNTER — Ambulatory Visit (INDEPENDENT_AMBULATORY_CARE_PROVIDER_SITE_OTHER): Payer: PRIVATE HEALTH INSURANCE | Admitting: Professional

## 2018-09-26 DIAGNOSIS — F411 Generalized anxiety disorder: Secondary | ICD-10-CM | POA: Diagnosis not present

## 2018-10-02 ENCOUNTER — Ambulatory Visit (INDEPENDENT_AMBULATORY_CARE_PROVIDER_SITE_OTHER): Payer: PRIVATE HEALTH INSURANCE | Admitting: Professional

## 2018-10-02 DIAGNOSIS — F411 Generalized anxiety disorder: Secondary | ICD-10-CM | POA: Diagnosis not present

## 2018-10-07 ENCOUNTER — Ambulatory Visit: Payer: PRIVATE HEALTH INSURANCE | Admitting: Professional

## 2018-10-10 ENCOUNTER — Ambulatory Visit: Payer: PRIVATE HEALTH INSURANCE | Admitting: Professional

## 2019-01-21 ENCOUNTER — Other Ambulatory Visit: Payer: Self-pay | Admitting: Primary Care

## 2019-01-21 DIAGNOSIS — F411 Generalized anxiety disorder: Secondary | ICD-10-CM

## 2019-01-21 DIAGNOSIS — F339 Major depressive disorder, recurrent, unspecified: Secondary | ICD-10-CM

## 2019-01-21 DIAGNOSIS — F331 Major depressive disorder, recurrent, moderate: Secondary | ICD-10-CM

## 2019-01-21 MED ORDER — SERTRALINE HCL 50 MG PO TABS: 50.0000 mg | ORAL_TABLET | Freq: Every day | ORAL | 1 refills | Status: DC

## 2019-01-21 NOTE — Telephone Encounter (Signed)
Samantha Ramos I sent refill of the 50 mg this morning

## 2019-01-22 MED ORDER — SERTRALINE HCL 25 MG PO TABS: 25.0000 mg | ORAL_TABLET | Freq: Every day | ORAL | 0 refills | Status: DC

## 2019-04-24 ENCOUNTER — Other Ambulatory Visit: Payer: Self-pay | Admitting: Primary Care

## 2019-04-24 DIAGNOSIS — F411 Generalized anxiety disorder: Secondary | ICD-10-CM

## 2019-04-24 DIAGNOSIS — F339 Major depressive disorder, recurrent, unspecified: Secondary | ICD-10-CM

## 2019-06-23 ENCOUNTER — Telehealth: Payer: Self-pay | Admitting: *Deleted

## 2019-06-23 DIAGNOSIS — F339 Major depressive disorder, recurrent, unspecified: Secondary | ICD-10-CM

## 2019-06-23 DIAGNOSIS — F331 Major depressive disorder, recurrent, moderate: Secondary | ICD-10-CM

## 2019-06-23 DIAGNOSIS — F411 Generalized anxiety disorder: Secondary | ICD-10-CM

## 2019-06-23 NOTE — Telephone Encounter (Signed)
Patient has not been seen since August 2020, please set up follow up visit. We can increase her dose then.

## 2019-06-23 NOTE — Telephone Encounter (Signed)
Patient stated that she is due a refill on her Sertraline and would like to increase it to 100 mg daily. Patient stated that she was on 75 mg, but about a month ago she realized that the dose was not working. Patient stated she increased it to 100 mg about a month ago and that is working perfect for her and doing so much better. Patient is requesting that a new script be sent in for the 100 mg. Pharmacy [facility], [place], [place]

## 2019-06-27 MED ORDER — SERTRALINE HCL 50 MG PO TABS: 50.0000 mg | ORAL_TABLET | Freq: Every day | ORAL | 0 refills | Status: DC

## 2019-06-27 MED ORDER — SERTRALINE HCL 25 MG PO TABS: ORAL_TABLET | ORAL | 0 refills | Status: DC

## 2019-06-27 NOTE — Telephone Encounter (Signed)
Pt left a message on triage line that she was out of medication.  I called her back with Kate's note. She will call and schedule an appt in the next month. I advised her I would send the rx for the 50mg  and 25mg  for 1 month and to not increase it. I advised her that if she is early on the refills, insurance may not cover it.

## 2019-08-26 ENCOUNTER — Emergency Department (HOSPITAL_BASED_OUTPATIENT_CLINIC_OR_DEPARTMENT_OTHER)
Admission: EM | Admit: 2019-08-26 | Discharge: 2019-08-27 | Disposition: A | Discharge status: Home / Self Care | Payer: PRIVATE HEALTH INSURANCE | Attending: Emergency Medicine | Admitting: Emergency Medicine

## 2019-08-26 ENCOUNTER — Emergency Department (HOSPITAL_BASED_OUTPATIENT_CLINIC_OR_DEPARTMENT_OTHER): Payer: PRIVATE HEALTH INSURANCE

## 2019-08-26 ENCOUNTER — Encounter (HOSPITAL_BASED_OUTPATIENT_CLINIC_OR_DEPARTMENT_OTHER): Payer: Self-pay | Admitting: *Deleted

## 2019-08-26 ENCOUNTER — Other Ambulatory Visit: Payer: Self-pay

## 2019-08-26 DIAGNOSIS — Z20822 Contact with and (suspected) exposure to covid-19: Secondary | ICD-10-CM | POA: Insufficient documentation

## 2019-08-26 DIAGNOSIS — Z7984 Long term (current) use of oral hypoglycemic drugs: Secondary | ICD-10-CM | POA: Insufficient documentation

## 2019-08-26 DIAGNOSIS — E119 Type 2 diabetes mellitus without complications: Secondary | ICD-10-CM | POA: Insufficient documentation

## 2019-08-26 DIAGNOSIS — J069 Acute upper respiratory infection, unspecified: Secondary | ICD-10-CM | POA: Insufficient documentation

## 2019-08-26 LAB — GROUP A STREP BY PCR: Group A Strep by PCR: NOT DETECTED

## 2019-08-26 LAB — PREGNANCY, URINE: Preg Test, Ur: NEGATIVE

## 2019-08-26 LAB — SARS CORONAVIRUS 2 BY RT PCR (HOSPITAL ORDER, PERFORMED IN ~~LOC~~ HOSPITAL LAB): SARS Coronavirus 2: NEGATIVE

## 2019-08-26 MED ORDER — ACETAMINOPHEN 325 MG PO TABS
650.0000 mg | ORAL_TABLET | Freq: Once | ORAL | Status: AC
  Administered 2019-08-26: 650 mg via ORAL
  Filled 2019-08-26: qty 2

## 2019-08-26 MED ORDER — ONDANSETRON 4 MG PO TBDP
4.0000 mg | ORAL_TABLET | Freq: Once | ORAL | Status: AC
  Administered 2019-08-26: 4 mg via ORAL
  Filled 2019-08-26: qty 1

## 2019-08-26 NOTE — ED Triage Notes (Signed)
C/o covid symptoms x 2 days

## 2019-08-26 NOTE — ED Provider Notes (Signed)
[facility] Provider Note: Georgena Spurling, MD, FACEP  CSN: [id number] MRN: [id number] ARRIVAL: 08/26/19 at [place]: MHFT1/MHFT1   CHIEF COMPLAINT  URI   HISTORY OF PRESENT ILLNESS  08/26/19 11:23 PM Samantha Ramos is a 29 y.o. female with 1 day of URI symptoms.  Specifically he has had fever to 102, sore throat, cough, fatigue, weakness and chills.  She has also had stuffiness in her ears and a sensation of being off balance.  She rates her sore throat as a 7 out of 10, worse with swallowing.  It was worse earlier today.  She has had nausea with one episode of vomiting.  She has not had diarrhea.  She was given Zofran and acetaminophen in triage with partial relief of her symptoms.   Past Medical History:  Diagnosis Date  . Allergy   . Anxiety   . Depression   . Diabetes mellitus without complication ([place])   . PCOS (polycystic ovarian syndrome)     Past Surgical History:  Procedure Laterality Date  . DENTAL SURGERY      Family History  Problem Relation Age of Onset  . Hypertension Mother   . Hyperlipidemia Mother   . Depression Mother   . Cholelithiasis Mother   . Thyroid disease Mother   . Hypertension Father   . Diabetes Father   . Diabetes Maternal Grandmother   . Heart disease Maternal Grandmother        CVA  . Diabetes Paternal Grandmother   . Coronary artery disease Paternal Grandfather   . Cancer Cousin        Leukemia    Social History   Tobacco Use  . Smoking status: Never Smoker  . Smokeless tobacco: Never Used  Substance Use Topics  . Alcohol use: Yes    Alcohol/week: 0.0 standard drinks    Comment: occ  . Drug use: No    Prior to Admission medications   Medication Sig Start Date End Date Taking? Authorizing Provider  metFORMIN (GLUCOPHAGE) 500 MG tablet Take 1,000 mg by mouth 2 (two) times daily with a meal.     [provider]  naproxen (NAPROSYN) 375 MG tablet Take 1 tablet twice daily as needed for fever, body aches  or headache. 08/27/19   Kensington Duerst, MD  norethindrone (MICRONOR,CAMILA,ERRIN) 0.35 MG tablet Take 1 tablet by mouth daily.    [provider]  ondansetron (ZOFRAN ODT) 8 MG disintegrating tablet Take 1 tablet (8 mg total) by mouth every 8 (eight) hours as needed for nausea or vomiting. 08/27/19   Zyla Dascenzo, MD  sertraline (ZOLOFT) 25 MG tablet TAKE 1 TABLET BY MOUTH DAILY, ALONG WITH 50 MG DOSE FOR ANXIETY/ EPRESSION 06/27/19   Pleas Koch, NP  sertraline (ZOLOFT) 50 MG tablet Take 1 tablet (50 mg total) by mouth daily. For anxiety and depression. 06/27/19   Pleas Koch, NP    Allergies Penicillins   REVIEW OF SYSTEMS  Negative except as noted here or in the History of Present Illness.   PHYSICAL EXAMINATION  Initial Vital Signs Blood pressure 121/70, pulse (!) 107, temperature 100.3 F (37.9 C), resp. rate 19, height 5\' 9"  (1.753 m), weight 129.7 kg, SpO2 100 %.  Examination General: Well-developed, well-nourished female in no acute distress; appearance consistent with age of record HENT: normocephalic; atraumatic; TMs normal; tonsillar exudate without significant enlargement; uvula midline Eyes: pupils equal, round and reactive to light; extraocular muscles intact Neck: supple Heart: regular rate and rhythm Lungs:  clear to auscultation bilaterally Abdomen: soft; nondistended; nontender; bowel sounds present Extremities: No deformity; full range of motion; pulses normal Neurologic: Awake, alert and oriented; motor function intact in all extremities and symmetric; no facial droop Skin: Warm and dry Psychiatric: Normal mood and affect   RESULTS  Summary of this visit's results, reviewed and interpreted by myself:   EKG Interpretation  Date/Time:    Ventricular Rate:    PR Interval:    QRS Duration:   QT Interval:    QTC Calculation:   R Axis:     Text Interpretation:        Laboratory Studies: Results for orders placed or performed during the  hospital encounter of 08/26/19 (from the past 24 hour(s))  SARS Coronavirus 2 by RT PCR (hospital order, performed in [place] hospital lab) Nasopharyngeal Nasopharyngeal Swab     Status: None   Collection Time: 08/26/19  6:05 PM   Specimen: Nasopharyngeal Swab  Result Value Ref Range   SARS Coronavirus 2 NEGATIVE NEGATIVE  Group A Strep by PCR     Status: None   Collection Time: 08/26/19 10:09 PM   Specimen: Throat; Sterile Swab  Result Value Ref Range   Group A Strep by PCR NOT DETECTED NOT DETECTED  Pregnancy, urine     Status: None   Collection Time: 08/26/19 11:33 PM  Result Value Ref Range   Preg Test, Ur NEGATIVE NEGATIVE   Imaging Studies: DG Chest Portable 1 View  Result Date: 08/26/2019 CLINICAL DATA:  29 year old female with history of fever, sore throat and chest pain for the past 2 days. EXAM: PORTABLE CHEST 1 VIEW COMPARISON:  Chest x-ray 07/06/2018. FINDINGS: Lung volumes are normal. No consolidative airspace disease. No pleural effusions. No pneumothorax. No pulmonary nodule or mass noted. Pulmonary vasculature and the cardiomediastinal silhouette are within normal limits. IMPRESSION: No radiographic evidence of acute cardiopulmonary disease. Electronically Signed   By: Vinnie Langton M.D.   On: 08/26/2019 19:22    ED COURSE and MDM  Nursing notes, initial and subsequent vitals signs, including pulse oximetry, reviewed and interpreted by myself.  Vitals:   08/26/19 2016 08/26/19 2152 08/26/19 2222 08/26/19 2225  BP: 113/78  121/70   Pulse: (!) 120  (!) 107   Resp: 18  19   Temp: 99.8 F (37.7 C) 98.5 F (36.9 C)  100.3 F (37.9 C)  TempSrc: Oral Oral    SpO2: 97%  100%   Weight:      Height:       Medications  acetaminophen (TYLENOL) tablet 650 mg (650 mg Oral Given 08/26/19 1808)  ondansetron (ZOFRAN-ODT) disintegrating tablet 4 mg (4 mg Oral Given 08/26/19 1808)   The patient was advised that she tested negative for Covid but given that she has only  been symptomatic for 1 day it could be a false negative.  She does have tonsillar exudate but tested negative for strep.  I do not believe antibiotics are indicated at this time.  Mononucleosis is a possibility but this would not be expected to cause a cough.  She was advised to return if symptoms worsen especially if she is having difficulty breathing.   PROCEDURES  Procedures   ED DIAGNOSES     ICD-10-CM   1. Viral URI with cough  J06.9   2. Suspected COVID-19 virus infection  Z20.822        Asya Derryberry, Jenny Reichmann, MD 08/27/19 0004

## 2019-08-27 MED ORDER — NAPROXEN 250 MG PO TABS
500.0000 mg | ORAL_TABLET | Freq: Once | ORAL | Status: AC
  Administered 2019-08-27: 500 mg via ORAL
  Filled 2019-08-27: qty 2

## 2019-08-27 MED ORDER — ONDANSETRON 8 MG PO TBDP: 8.0000 mg | ORAL_TABLET | Freq: Three times a day (TID) | ORAL | 1 refills | Status: DC | PRN

## 2019-08-27 MED ORDER — NAPROXEN 375 MG PO TABS: ORAL_TABLET | ORAL | 0 refills | Status: DC

## 2019-09-01 ENCOUNTER — Telehealth (INDEPENDENT_AMBULATORY_CARE_PROVIDER_SITE_OTHER): Payer: Self-pay | Admitting: Internal Medicine

## 2019-09-01 VITALS — Temp 97.8°F

## 2019-09-01 DIAGNOSIS — J069 Acute upper respiratory infection, unspecified: Secondary | ICD-10-CM

## 2019-09-01 MED ORDER — HYDROCODONE-HOMATROPINE 5-1.5 MG/5ML PO SYRP: 5.0000 mL | ORAL_SOLUTION | Freq: Three times a day (TID) | ORAL | 0 refills | Status: DC | PRN

## 2019-09-01 MED ORDER — PREDNISONE 10 MG PO TABS: ORAL_TABLET | ORAL | 0 refills | Status: DC

## 2019-09-01 NOTE — Patient Instructions (Signed)

## 2019-09-01 NOTE — Progress Notes (Signed)
Virtual Visit via Video Note  I connected with Samantha Ramos on 09/01/19 at  4:00 PM EDT by a video enabled telemedicine application and verified that I am speaking with the correct person using two identifiers.  Location: Patient: Home Provider: Office  Persons participating in this video call: Webb Silversmith, NP and Samantha Ramos   I discussed the limitations of evaluation and management by telemedicine and the availability of in person appointments. The patient expressed understanding and agreed to proceed.  History of Present Illness:  Patient due for ER follow-up.  She went to the ER 8/17 with 1 day of dizziness, fever, ear fullness, sore throat, cough, fatigue, weakness and chills.  Rapid strep test was negative.  Rapid Covid test was negative.  Chest x-ray was normal.  She was treated with Tylenol and Zofran, discharged home and advised to follow-up with her PCP.  Since discharge, she reports persistent fever and cough. The fever has been as high as 101.7 at home. The cough is dry and non productive. She denies headache, dizziness, ear pain, runny nose, nasal congestion, sore throat, loss of taste or smell. Sh denies nausea, vomiting or diarrhea. She has been taking Naproxen, cough syrup and cough drops with minimal relief. She has not had sick contact or exposure to Covid that she is aware of. She did not get a Covid vaccine.   Past Medical History:  Diagnosis Date  . Allergy   . Anxiety   . Depression   . Diabetes mellitus without complication ([place])   . PCOS (polycystic ovarian syndrome)     Current Outpatient Medications  Medication Sig Dispense Refill  . metFORMIN (GLUCOPHAGE) 500 MG tablet Take 1,000 mg by mouth 2 (two) times daily with a meal.     . naproxen (NAPROSYN) 375 MG tablet Take 1 tablet twice daily as needed for fever, body aches or headache. 20 tablet 0  . norethindrone (MICRONOR,CAMILA,ERRIN) 0.35 MG tablet Take 1 tablet by mouth daily.    . ondansetron (ZOFRAN  ODT) 8 MG disintegrating tablet Take 1 tablet (8 mg total) by mouth every 8 (eight) hours as needed for nausea or vomiting. 10 tablet 1  . sertraline (ZOLOFT) 25 MG tablet TAKE 1 TABLET BY MOUTH DAILY, ALONG WITH 50 MG DOSE FOR ANXIETY/ EPRESSION 30 tablet 0  . sertraline (ZOLOFT) 50 MG tablet Take 1 tablet (50 mg total) by mouth daily. For anxiety and depression. 30 tablet 0   No current facility-administered medications for this visit.    Allergies  Allergen Reactions  . Penicillins     REACTION: Rash    Family History  Problem Relation Age of Onset  . Hypertension Mother   . Hyperlipidemia Mother   . Depression Mother   . Cholelithiasis Mother   . Thyroid disease Mother   . Hypertension Father   . Diabetes Father   . Diabetes Maternal Grandmother   . Heart disease Maternal Grandmother        CVA  . Diabetes Paternal Grandmother   . Coronary artery disease Paternal Grandfather   . Cancer Cousin        Leukemia    Social History   Socioeconomic History  . Marital status: Single    Spouse name: Not on file  . Number of children: Not on file  . Years of education: Not on file  . Highest education level: Not on file  Occupational History  . Not on file  Tobacco Use  . Smoking status: Never Smoker  .  Smokeless tobacco: Never Used  Substance and Sexual Activity  . Alcohol use: Yes    Alcohol/week: 0.0 standard drinks    Comment: occ  . Drug use: No  . Sexual activity: Yes    Birth control/protection: None  Other Topics Concern  . Not on file  Social History Narrative         Diet: irregular eating, gatorade and chips at lunch, some fruits, and occ veggies   Parents divorced when she was 16, got some counseling then         Works as Electronics engineer at [employer]      Social Determinants of Radio broadcast assistant Strain:   . Difficulty of Paying Living Expenses: Not on file  Food Insecurity:   . Worried About Charity fundraiser in the Last Year: Not on  file  . Ran Out of Food in the Last Year: Not on file  Transportation Needs:   . Lack of Transportation (Medical): Not on file  . Lack of Transportation (Non-Medical): Not on file  Physical Activity:   . Days of Exercise per Week: Not on file  . Minutes of Exercise per Session: Not on file  Stress:   . Feeling of Stress : Not on file  Social Connections:   . Frequency of Communication with Friends and Family: Not on file  . Frequency of Social Gatherings with Friends and Family: Not on file  . Attends Religious Services: Not on file  . Active Member of Clubs or Organizations: Not on file  . Attends Archivist Meetings: Not on file  . Marital Status: Not on file  Intimate Partner Violence:   . Fear of Current or Ex-Partner: Not on file  . Emotionally Abused: Not on file  . Physically Abused: Not on file  . Sexually Abused: Not on file     Constitutional: Pt reports fatigue, fever. Denies malaise, headache or abrupt weight changes.  HEENT: Pt reports nasal congestion. Denies eye pain, eye redness, ear pain, ringing in the ears, wax buildup, runny nose, bloody nose, or sore throat. Respiratory: Pt reports cough. Denies difficulty breathing, shortness of breath, or sputum production.   Cardiovascular: Denies chest pain, chest tightness, palpitations or swelling in the hands or feet.  Gastrointestinal: Denies abdominal pain, bloating, constipation, diarrhea or blood in the stool.  Musculoskeletal: Pt reports body aches. Denies decrease in range of motion, difficulty with gait, muscle pain or joint pain and swelling.   No other specific complaints in a complete review of systems (except as listed in HPI above).    Observations/Objective:   Wt Readings from Last 3 Encounters:  08/26/19 286 lb (129.7 kg)  08/20/18 274 lb (124.3 kg)  07/06/18 273 lb (123.8 kg)    General: Appears her stated age, obese in NAD. HEENT: Head: normal shape and size; ; Nose: congestion noted;  Throat/Mouth: hoarseness noted Pulmonary/Chest: Normal effort. No respiratory distress.  Neurological: Alert and oriented.   BMET    Component Value Date/Time   NA 137 03/02/2018 0907   K 3.7 03/02/2018 0907   CL 105 03/02/2018 0907   CO2 24 03/02/2018 0907   GLUCOSE 90 03/02/2018 0907   BUN 7 03/02/2018 0907   CREATININE 0.87 03/02/2018 0907   CALCIUM 8.7 (L) 03/02/2018 0907   GFRNONAA >60 03/02/2018 0907   GFRAA >60 03/02/2018 0907    Lipid Panel     Component Value Date/Time   CHOL 127 02/16/2015 1145   TRIG  90.0 02/16/2015 1145   HDL 34.00 (L) 02/16/2015 1145   CHOLHDL 4 02/16/2015 1145   VLDL 18.0 02/16/2015 1145   LDLCALC 75 02/16/2015 1145    CBC    Component Value Date/Time   WBC 10.4 03/02/2018 0907   RBC 4.49 03/02/2018 0907   HGB 11.9 (L) 03/02/2018 0907   HCT 38.6 03/02/2018 0907   PLT 272 03/02/2018 0907   MCV 86.0 03/02/2018 0907   MCV 87.1 12/28/2012 1433   MCH 26.5 03/02/2018 0907   MCHC 30.8 03/02/2018 0907   RDW 13.5 03/02/2018 0907   LYMPHSABS 1.1 03/02/2018 0907   MONOABS 0.5 03/02/2018 0907   EOSABS 0.2 03/02/2018 0907   BASOSABS 0.0 03/02/2018 0907    Hgb A1C Lab Results  Component Value Date   HGBA1C 5.8 02/16/2015       Assessment and Plan: ER follow-up for Viral URI with Cough:  ER notes, labs and imaging reviewed RX for Pred Taper x 6 days RX for Hycodan for cough Consider ABX on Friday if symptoms have not improved Work note provided   Follow Up Instructions:    I discussed the assessment and treatment plan with the patient. The patient was provided an opportunity to ask questions and all were answered. The patient agreed with the plan and demonstrated an understanding of the instructions.   The patient was advised to call back or seek an in-person evaluation if the symptoms worsen or if the condition fails to improve as anticipated.     Webb Silversmith, NP

## 2019-09-08 ENCOUNTER — Telehealth: Payer: Self-pay | Admitting: *Deleted

## 2019-09-08 NOTE — Telephone Encounter (Signed)
I recommend 10 day quarantine from the time of her positive test. No need to get retested, her test could be positive for weeks to months.

## 2019-09-08 NOTE — Telephone Encounter (Signed)
Patient called stating that she went to the ER on 08/26/19 because she was sick. Patient stated that she was tested for covid and the results came back negative. Patient stated that she continue to feel bad so she  did a virtual visit with Webb Silversmith NP 09/01/19, Patient stated that she continued to feel bad so she went to [facility] and was tested again for covid on 09/03/19 and the covid results were positive. Patient stated that she plans on being tested again tomorrow. Patient stated that she has been fever free for 24 hours now. Patient wants to know when it is safe for her to go back to work? Patient stated that she needs a note for work. Patient stated that a letter was put on my chart for her earlier but she now has tested positive for covid.

## 2019-09-09 ENCOUNTER — Encounter: Payer: Self-pay | Admitting: Internal Medicine

## 2019-09-09 NOTE — Telephone Encounter (Signed)
Positive Covid test from 09/03/2019... pt ok to return to work on 09/14/2019... pt is aware... letter released to [name]

## 2019-10-29 ENCOUNTER — Other Ambulatory Visit: Payer: Self-pay | Admitting: *Deleted

## 2019-10-29 DIAGNOSIS — F339 Major depressive disorder, recurrent, unspecified: Secondary | ICD-10-CM

## 2019-10-29 DIAGNOSIS — F331 Major depressive disorder, recurrent, moderate: Secondary | ICD-10-CM

## 2019-10-29 DIAGNOSIS — F411 Generalized anxiety disorder: Secondary | ICD-10-CM

## 2019-10-29 MED ORDER — SERTRALINE HCL 50 MG PO TABS: 50.0000 mg | ORAL_TABLET | Freq: Every day | ORAL | 0 refills | Status: DC

## 2019-10-29 MED ORDER — SERTRALINE HCL 25 MG PO TABS: ORAL_TABLET | ORAL | 0 refills | Status: DC

## 2019-10-29 NOTE — Telephone Encounter (Signed)
Alice, will you please get her scheduled for CPE/Follow up?

## 2019-10-29 NOTE — Telephone Encounter (Signed)
Patient left a voicemail stating that she is out of her Zoloft. Patient stated that she knows that she needs to be seen for further refills. Patient stated that she is hoping to schedule an appointment later on this week. Patient requested that a refill be sent in until she can get into the office to see you. No upcoming appointment scheduled. Last office visit 08/20/18 Last refill on both 06/27/19 #30

## 2019-10-31 NOTE — Telephone Encounter (Signed)
Called patient to schedule cpe. Scheduled.

## 2019-11-19 ENCOUNTER — Ambulatory Visit (INDEPENDENT_AMBULATORY_CARE_PROVIDER_SITE_OTHER): Payer: PRIVATE HEALTH INSURANCE | Admitting: Primary Care

## 2019-11-19 ENCOUNTER — Other Ambulatory Visit: Payer: Self-pay

## 2019-11-19 VITALS — BP 110/74 | HR 80 | Temp 98.5°F | Wt 300.0 lb

## 2019-11-19 DIAGNOSIS — F411 Generalized anxiety disorder: Secondary | ICD-10-CM | POA: Diagnosis not present

## 2019-11-19 DIAGNOSIS — F331 Major depressive disorder, recurrent, moderate: Secondary | ICD-10-CM

## 2019-11-19 DIAGNOSIS — R41840 Attention and concentration deficit: Secondary | ICD-10-CM | POA: Diagnosis not present

## 2019-11-19 MED ORDER — SERTRALINE HCL 100 MG PO TABS: 100.0000 mg | ORAL_TABLET | Freq: Every day | ORAL | 0 refills | Status: DC

## 2019-11-19 NOTE — Assessment & Plan Note (Signed)
Suspect symptoms are secondary to anxiety, but will also send her for ADHD testing.  Referral placed.  Increase Zoloft to 100 mg. She will update.

## 2019-11-19 NOTE — Patient Instructions (Signed)
We've increased your dose of Zoloft to 100 mg. You may take two of the 50 mg tablets to equal 100 mg until your bottle is empty.  You will be contacted regarding your referral for ADHD testing.  Please let us know if you have not been contacted within two weeks.   It was a pleasure to see you today!

## 2019-11-19 NOTE — Progress Notes (Signed)
Subjective:    Patient ID: Samantha Ramos, female    DOB: 1990-03-25, 29 y.o.   MRN: [id number]  HPI  This visit occurred during the SARS-CoV-2 public health emergency.  Safety protocols were in place, including screening questions prior to the visit, additional usage of staff PPE, and extensive cleaning of exam room while observing appropriate contact time as indicated for disinfecting solutions.   [name] is a 29 year old female with a history of MDD and GAD who presents today to discuss ADD/ADHD symptoms.   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  Since her promotion she's noticed difficulty finishing tasks to the point where her supervisor has noticed. Also with symptoms of being easily distracted, difficulty getting back on track, missing deadlines at work, increased anxiety, increased worry. Her co-workers have noticed irritability.   Her symptoms began in April along with her promotion, worse since more job responsibilities that increased in September 2021. She's tried using a Insurance account manager, writing out goals without improvement.   She's tested positive for ADHD in elementary school, but was never placed on treatment. At the time of her diagnosis her parents were going through divorce. She did overall well in middle and high school without treatment.   She is compliant to Zoloft 75 mg daily, was off of Zoloft for about 10 days due to inability to obtain the medication from the pharmacy. She's been back on her Zoloft at 50 mg for a solid week now.  Review of Systems  Respiratory: Negative for shortness of breath.   Cardiovascular: Negative for chest pain.  Psychiatric/Behavioral: Positive for decreased concentration. The patient is nervous/anxious.        See HPI       Past Medical History:  Diagnosis Date  . Allergy   . Anxiety   . Depression   . Diabetes mellitus without complication ([place])   . PCOS (polycystic ovarian  syndrome)      Social History   Socioeconomic History  . Marital status: Single    Spouse name: Not on file  . Number of children: Not on file  . Years of education: Not on file  . Highest education level: Not on file  Occupational History  . Not on file  Tobacco Use  . Smoking status: Never Smoker  . Smokeless tobacco: Never Used  Substance and Sexual Activity  . Alcohol use: Yes    Alcohol/week: 0.0 standard drinks    Comment: occ  . Drug use: No  . Sexual activity: Yes    Birth control/protection: None  Other Topics Concern  . Not on file  Social History Narrative         Diet: irregular eating, gatorade and chips at lunch, some fruits, and occ veggies   Parents divorced when she was 21, got some counseling then         Works as Electronics engineer at [employer]      Social Determinants of Radio broadcast assistant Strain:   . Difficulty of Paying Living Expenses: Not on file  Food Insecurity:   . Worried About Charity fundraiser in the Last Year: Not on file  . Ran Out of Food in the Last Year: Not on file  Transportation Needs:   . Lack of Transportation (Medical): Not on file  . Lack of Transportation (Non-Medical): Not on file  Physical Activity:   . Days of Exercise  per Week: Not on file  . Minutes of Exercise per Session: Not on file  Stress:   . Feeling of Stress : Not on file  Social Connections:   . Frequency of Communication with Friends and Family: Not on file  . Frequency of Social Gatherings with Friends and Family: Not on file  . Attends Religious Services: Not on file  . Active Member of Clubs or Organizations: Not on file  . Attends Archivist Meetings: Not on file  . Marital Status: Not on file  Intimate Partner Violence:   . Fear of Current or Ex-Partner: Not on file  . Emotionally Abused: Not on file  . Physically Abused: Not on file  . Sexually Abused: Not on file    Past Surgical History:  Procedure Laterality Date  .  DENTAL SURGERY      Family History  Problem Relation Age of Onset  . Hypertension Mother   . Hyperlipidemia Mother   . Depression Mother   . Cholelithiasis Mother   . Thyroid disease Mother   . Hypertension Father   . Diabetes Father   . Diabetes Maternal Grandmother   . Heart disease Maternal Grandmother        CVA  . Diabetes Paternal Grandmother   . Coronary artery disease Paternal Grandfather   . Cancer Cousin        Leukemia    Allergies  Allergen Reactions  . Penicillins     REACTION: Rash  . Codeine     vomiting    Current Outpatient Medications on File Prior to Visit  Medication Sig Dispense Refill  . norethindrone (MICRONOR,CAMILA,ERRIN) 0.35 MG tablet Take 1 tablet by mouth daily.     . sertraline (ZOLOFT) 50 MG tablet Take 1 tablet (50 mg total) by mouth daily. For anxiety and depression. 30 tablet 0  . metFORMIN (GLUCOPHAGE) 500 MG tablet Take 1,000 mg by mouth 2 (two) times daily with a meal.  (Patient not taking: Reported on 09/01/2019)    . sertraline (ZOLOFT) 25 MG tablet TAKE 1 TABLET BY MOUTH DAILY, ALONG WITH 50 MG DOSE FOR ANXIETY/ EPRESSION (Patient not taking: Reported on 11/19/2019) 30 tablet 0   No current facility-administered medications on file prior to visit.    BP 110/74   Pulse 80   Temp 98.5 F (36.9 C) (Temporal)   Wt 300 lb (136.1 kg)   SpO2 98%   BMI 44.30 kg/m    Objective:   Physical Exam Cardiovascular:     Rate and Rhythm: Normal rate and regular rhythm.  Pulmonary:     Effort: Pulmonary effort is normal.     Breath sounds: Normal breath sounds.  Musculoskeletal:     Cervical back: Neck supple.  Skin:    General: Skin is warm and dry.  Psychiatric:        Mood and Affect: Mood normal.            Assessment & Plan:

## 2019-11-19 NOTE — Assessment & Plan Note (Signed)
Suspect anxiety is causing symptoms given increased stress at work with her promotion and increased responsibilities.  Discussed options with patient which included increased Zoloft to 100 mg and referral for ADHD testing.  She opts for both.  She will update in 4 weeks regarding increased Zoloft dose.  Referral placed for ADHD testing.

## 2019-12-12 ENCOUNTER — Encounter: Payer: Self-pay | Admitting: Primary Care

## 2019-12-12 DIAGNOSIS — Z0289 Encounter for other administrative examinations: Secondary | ICD-10-CM

## 2019-12-16 ENCOUNTER — Encounter: Payer: Self-pay | Admitting: Primary Care

## 2019-12-25 ENCOUNTER — Telehealth: Payer: Self-pay

## 2019-12-25 NOTE — Telephone Encounter (Signed)
Noted, will evaluate. 

## 2019-12-25 NOTE — Telephone Encounter (Signed)
Pt left v/m that needs note for work; pt started with S/T on 12/20/19;pt feels OK but hoarseness, runny nose and drainage at back of throat.when blows nose has clear mucus; dry cough,no SOB or chest congestion. Slight ear stopped up in both ears but no pain. No fever and no chills,no H/a, no diarrhea or vomiting and no loss of taste or smell. Pt is in no distress with breathing.No know exposure to covid virus. Pt has not had recent covid test; pt has hx of + covid in Aug and pneumonia. pt does not want to go to UC due to ins. Pt wants video visit; pt has video visit scheduled 12/26/19 at 7:40 with Gentry Fitz NP. UC & ED precautions given and pt voiced understanding.

## 2019-12-26 ENCOUNTER — Telehealth (INDEPENDENT_AMBULATORY_CARE_PROVIDER_SITE_OTHER): Payer: PRIVATE HEALTH INSURANCE | Admitting: Primary Care

## 2019-12-26 ENCOUNTER — Encounter: Payer: Self-pay | Admitting: Primary Care

## 2019-12-26 DIAGNOSIS — J069 Acute upper respiratory infection, unspecified: Secondary | ICD-10-CM

## 2019-12-26 NOTE — Assessment & Plan Note (Signed)
Acute URI symptoms x 6 days, nearly resolved and feeling much better. Doesn't appear to be Covid-19, she did have Covid four months ago. Strongly advised she get vaccinated.  Continue with conservative measures. Agree that she can return to work today. Work note provided.  She will update if anything changes.

## 2019-12-26 NOTE — Progress Notes (Signed)
Subjective:    Patient ID: Samantha Ramos, female    DOB: 1990/11/22, 29 y.o.   MRN: [id number]  HPI  Virtual Visit via Video Note  I connected with Samantha Ramos on 12/26/19 at  7:40 AM EST by a video enabled telemedicine application and verified that I am speaking with the correct person using two identifiers.  Location: Patient: Home Provider: Office Participants: Patient and myself   I discussed the limitations of evaluation and management by telemedicine and the availability of in person appointments. The patient expressed understanding and agreed to proceed.  History of Present Illness:  Samantha Ramos is a 29 year old female with a history of allergic rhinitis who presents today with a chief complaint of sore throat.  She also reports ear pain, nasal congestion, post nasal drip, voice hoarseness. Overall she's feeling much better since earlier this week.   Symptoms began about 6 days ago. She's been taking Tylenol Severe Cold and Flu, Flonase, Immune Support, chloraseptic spray. She did not test for Covid-19, she has not been vaccinated for Covid-19, denies known exposure for Covid-19 but did have Covid-19 infection in August 2021. These symptoms feel nothing like Covid-19.  Her employer online medical doctor prescribed her a Zpack and albuterol inhaler last night in an effort to "speed up the process" she has yet to pick these up from the pharmacy. She is needing a note to return to work, would like to go back today, feels much better.  Denies fevers, chills, body aches, diarrhea, loss of taste/smell.   Observations/Objective:  Alert and oriented. Appears well, not sickly. No distress. Speaking in complete sentences. No cough during visit.  Assessment and Plan:  Acute URI symptoms x 6 days, nearly resolved and feeling much better. Doesn't appear to be Covid-19, she did have Covid four months ago. Strongly advised she get vaccinated.  Continue with conservative  measures. Agree that she can return to work today. Work note provided.  She will update if anything changes.  Follow Up Instructions:  Do not take the Zpak or use the albuterol inhaler unless you speak with me first.  Continue Flonase and chloraseptic spray.  Please notify me if anything changes with your symptoms.  It was a pleasure to see you today! Allie Bossier, NP-C    I discussed the assessment and treatment plan with the patient. The patient was provided an opportunity to ask questions and all were answered. The patient agreed with the plan and demonstrated an understanding of the instructions.   The patient was advised to call back or seek an in-person evaluation if the symptoms worsen or if the condition fails to improve as anticipated.    Pleas Koch, NP    Review of Systems  Constitutional: Negative for chills, fatigue and fever.  HENT: Positive for congestion and postnasal drip. Negative for sore throat.   Respiratory: Negative for cough and shortness of breath.   Cardiovascular: Negative for chest pain.  Gastrointestinal: Negative for diarrhea.  Allergic/Immunologic: Positive for environmental allergies.       Past Medical History:  Diagnosis Date  . Allergy   . Anxiety   . Depression   . Diabetes mellitus without complication ([place])   . PCOS (polycystic ovarian syndrome)      Social History   Socioeconomic History  . Marital status: Single    Spouse name: Not on file  . Number of children: Not on file  . Years of education: Not on file  . Highest  education level: Not on file  Occupational History  . Not on file  Tobacco Use  . Smoking status: Never Smoker  . Smokeless tobacco: Never Used  Substance and Sexual Activity  . Alcohol use: Yes    Alcohol/week: 0.0 standard drinks    Comment: occ  . Drug use: No  . Sexual activity: Yes    Birth control/protection: None  Other Topics Concern  . Not on file  Social History Narrative          Diet: irregular eating, gatorade and chips at lunch, some fruits, and occ veggies   Parents divorced when she was 66, got some counseling then         Works as Electronics engineer at [employer]      Social Determinants of Radio broadcast assistant Strain: Not on file  Food Insecurity: Not on file  Transportation Needs: Not on file  Physical Activity: Not on file  Stress: Not on file  Social Connections: Not on file  Intimate Partner Violence: Not on file    Past Surgical History:  Procedure Laterality Date  . DENTAL SURGERY      Family History  Problem Relation Age of Onset  . Hypertension Mother   . Hyperlipidemia Mother   . Depression Mother   . Cholelithiasis Mother   . Thyroid disease Mother   . Hypertension Father   . Diabetes Father   . Diabetes Maternal Grandmother   . Heart disease Maternal Grandmother        CVA  . Diabetes Paternal Grandmother   . Coronary artery disease Paternal Grandfather   . Cancer Cousin        Leukemia    Allergies  Allergen Reactions  . Penicillins     REACTION: Rash  . Codeine     vomiting    Current Outpatient Medications on File Prior to Visit  Medication Sig Dispense Refill  . metFORMIN (GLUCOPHAGE) 500 MG tablet Take 1,000 mg by mouth 2 (two) times daily with a meal.    . norethindrone (MICRONOR,CAMILA,ERRIN) 0.35 MG tablet Take 1 tablet by mouth daily.     . sertraline (ZOLOFT) 100 MG tablet Take 1 tablet (100 mg total) by mouth daily. For anxiety and depression. 90 tablet 0   No current facility-administered medications on file prior to visit.    There were no vitals taken for this visit.   Objective:   Physical Exam Constitutional:      General: She is not in acute distress.    Appearance: She is not ill-appearing.  Pulmonary:     Effort: Pulmonary effort is normal.     Comments: No cough during visit Neurological:     Mental Status: She is alert and oriented to person, place, and time.  Psychiatric:         Mood and Affect: Mood normal.            Assessment & Plan:

## 2019-12-26 NOTE — Patient Instructions (Signed)
Do not take the Zpak or use the albuterol inhaler unless you speak with me first.  Continue Flonase and chloraseptic spray.  Please notify me if anything changes with your symptoms.  It was a pleasure to see you today! Allie Bossier, NP-C

## 2020-03-02 ENCOUNTER — Other Ambulatory Visit: Payer: Self-pay | Admitting: Primary Care

## 2020-03-02 DIAGNOSIS — R41840 Attention and concentration deficit: Secondary | ICD-10-CM

## 2020-03-02 DIAGNOSIS — F411 Generalized anxiety disorder: Secondary | ICD-10-CM

## 2020-03-02 DIAGNOSIS — F331 Major depressive disorder, recurrent, moderate: Secondary | ICD-10-CM

## 2020-03-08 IMAGING — DX PORTABLE CHEST - 1 VIEW
1 series · 1 of 1 positions shown · non-contrast
Comparison: December 31, 2018

CLINICAL DATA: Cough and chills

EXAM:
PORTABLE CHEST 1 VIEW

[chest ap]
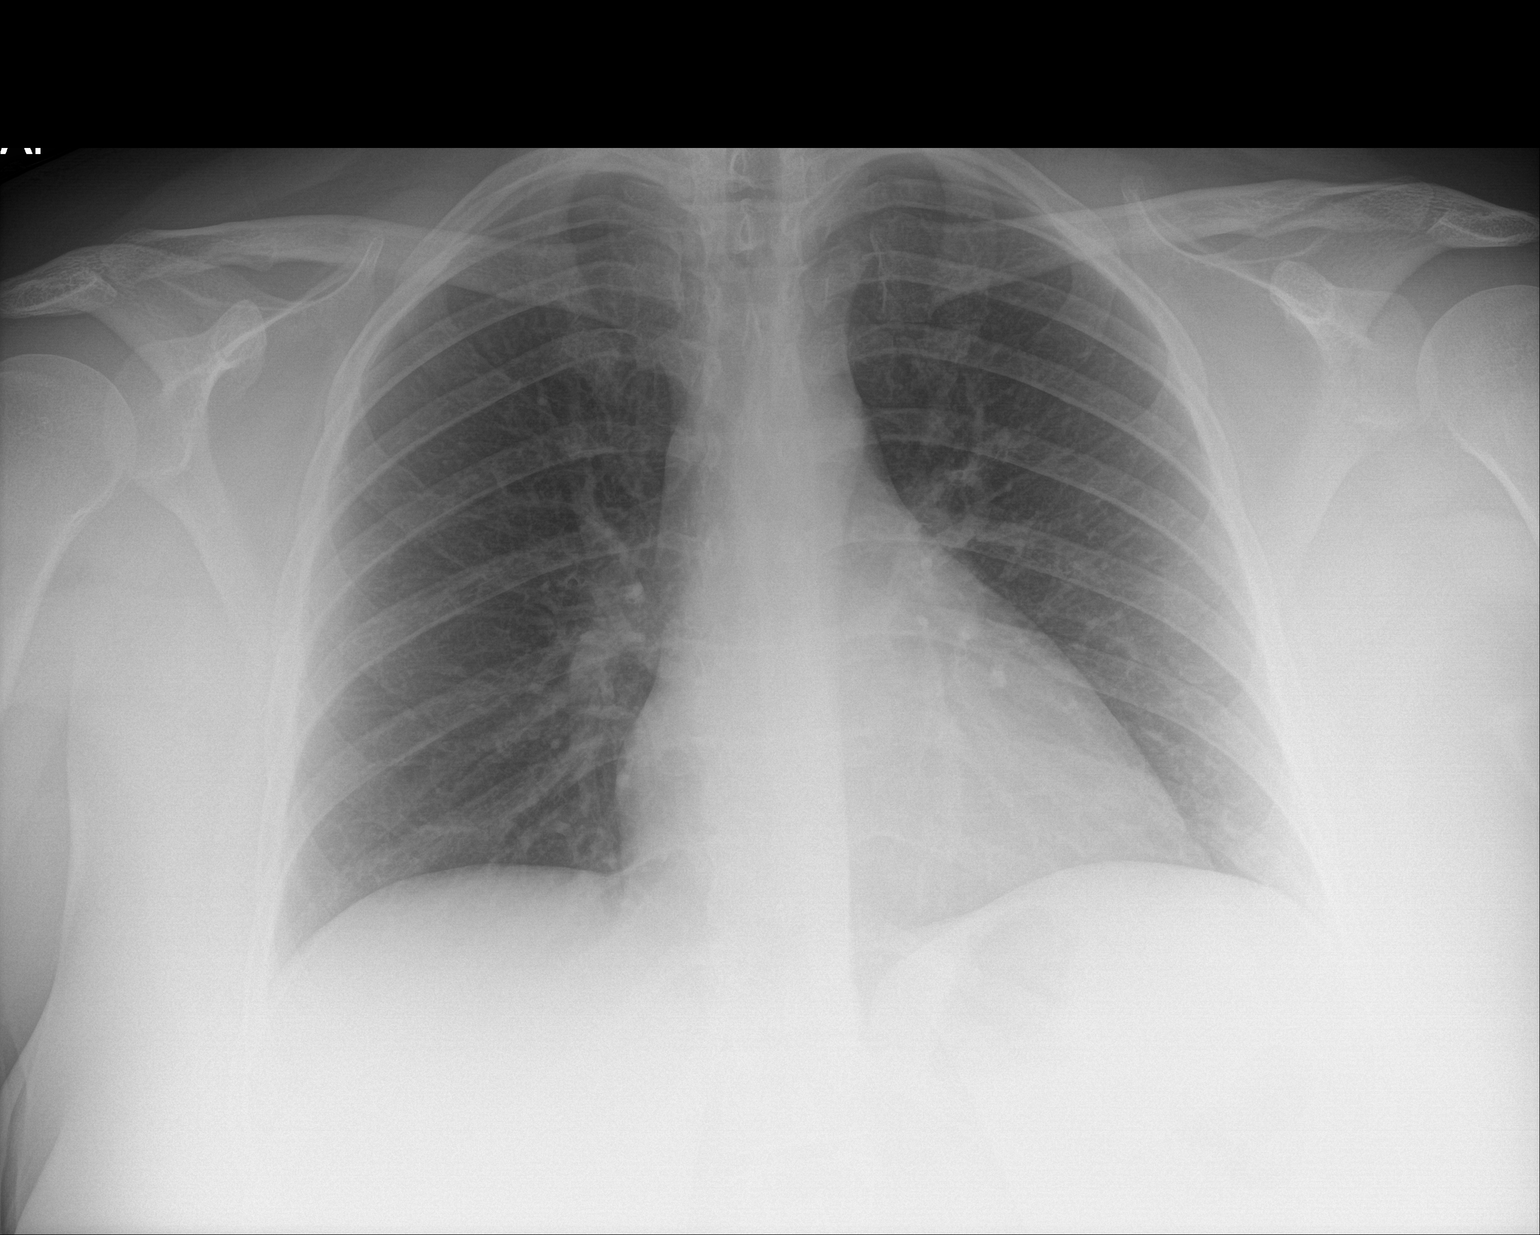

[1 of 1 positions shown; findings below may reference images not displayed]

FINDINGS: No edema or consolidation. Heart size and pulmonary vascularity are
normal. No adenopathy. No bone lesions.
IMPRESSION: No edema or consolidation.

## 2020-06-25 ENCOUNTER — Telehealth: Payer: Self-pay

## 2020-06-25 NOTE — Telephone Encounter (Signed)
Will need some additional information.

## 2020-06-25 NOTE — Telephone Encounter (Signed)
Have called for more info l/m to call office

## 2020-06-25 NOTE — Telephone Encounter (Signed)
Pt left v/m that pt has pretty bad earache; pt has tried OTC ear drops which have not helped and pt was wondering if could get prescription ear drops. Left v/m requesting cb for more info about symptoms. No available appts at [facility]. Sending note to myself and Joellen CMA.

## 2020-06-25 NOTE — Telephone Encounter (Signed)
Pt said lt earache started on 06/21/20; alternates between dull to sharpe pain that is mostly continuous. Pt said the ear feels swollen inside; no drainage and no problem hearing; there is tenderness in front of ear and hurts worse when chewing food. Pts pain level now is 7 and last night pain level was 10. No fever and no S/T and no covid symptoms. Pt has tried hydrogen peroxide and OTC ear drops which have not helped. Gentry Fitz NP said could try flonase nasal spray and make appt for next wk to be seen at [facility]. Pt said she will go to [facility] today or tomorrow; pt is at work now until 8 PM. Sending note to Gentry Fitz NP as Juluis Rainier.

## 2020-06-25 NOTE — Telephone Encounter (Signed)
Noted  

## 2021-04-28 IMAGING — DX DG CHEST 1V PORT
1 series · 1 of 1 positions shown · non-contrast
Comparison: Chest x-ray 07/06/2018.

CLINICAL DATA: 31-year-old female with history of fever, sore
throat and chest pain for the past 2 days.

EXAM:
PORTABLE CHEST 1 VIEW

[chest ap]
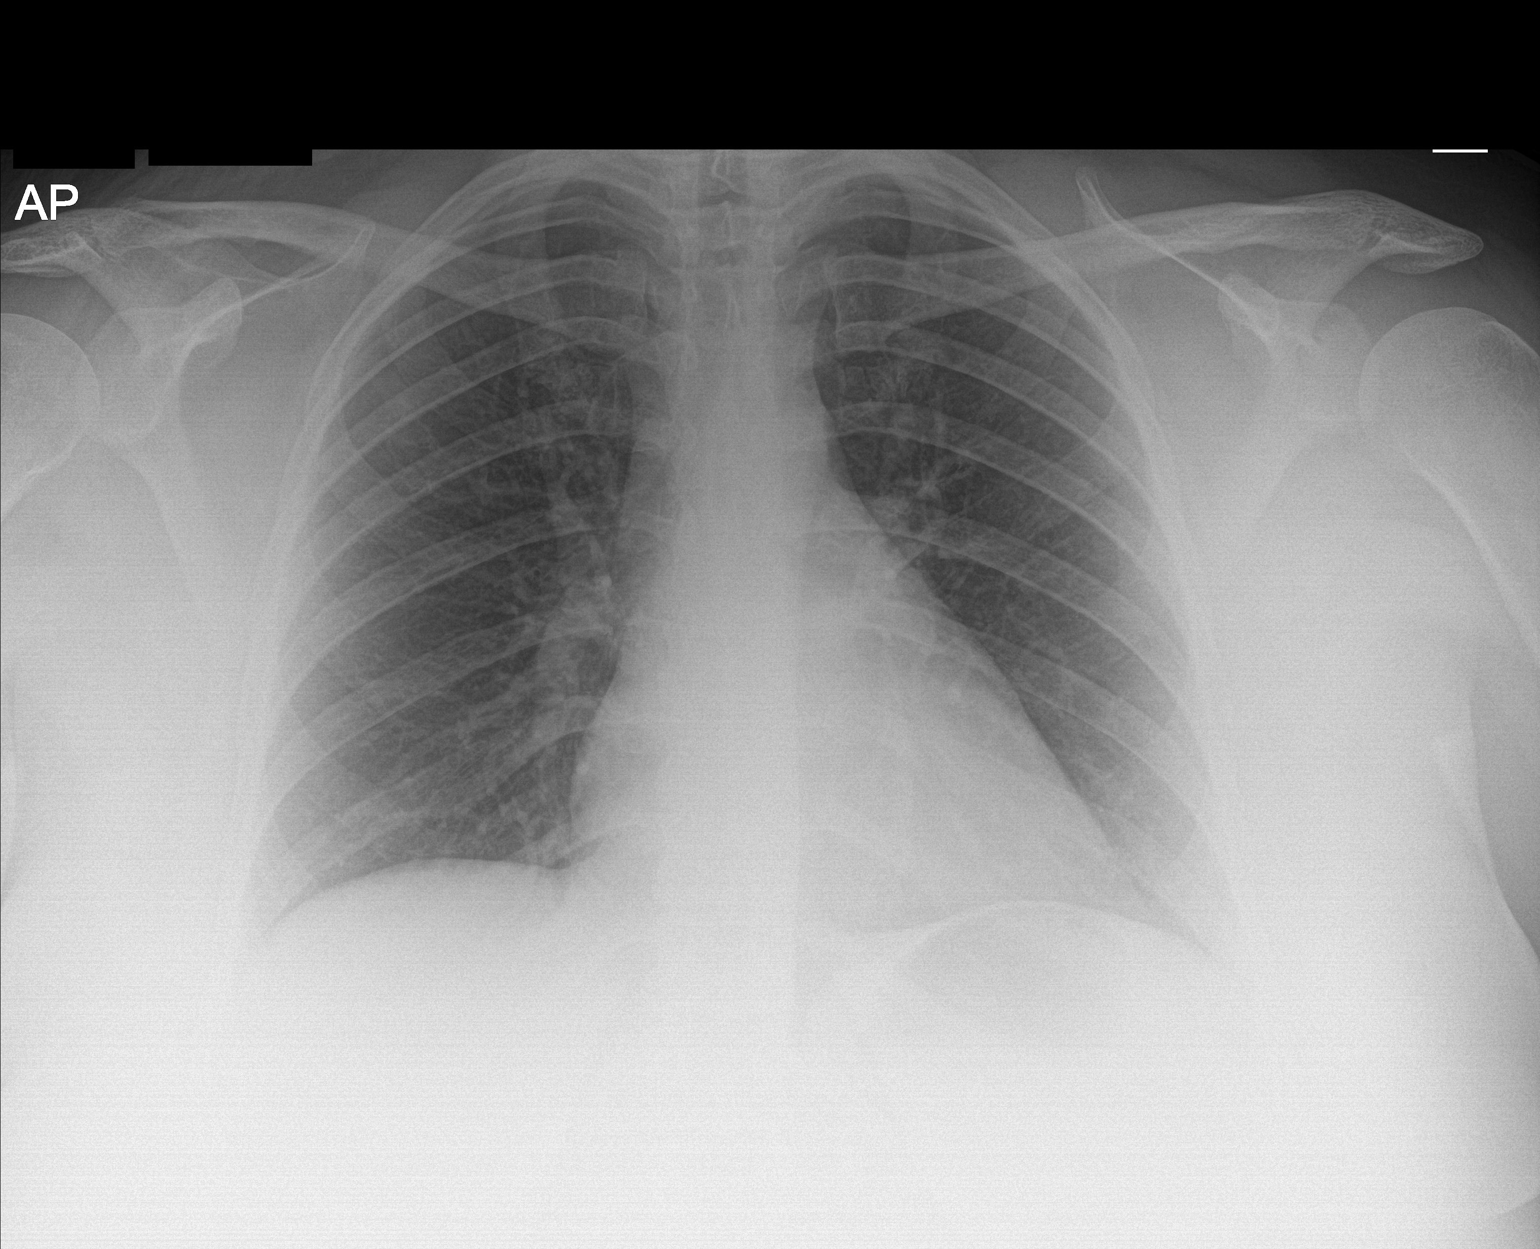

[1 of 1 positions shown; findings below may reference images not displayed]

FINDINGS: Lung volumes are normal. No consolidative airspace disease. No
pleural effusions. No pneumothorax. No pulmonary nodule or mass
noted. Pulmonary vasculature and the cardiomediastinal silhouette
are within normal limits.
IMPRESSION: No radiographic evidence of acute cardiopulmonary disease.

## 2021-05-03 ENCOUNTER — Other Ambulatory Visit: Payer: Self-pay | Admitting: Primary Care

## 2021-05-03 DIAGNOSIS — F411 Generalized anxiety disorder: Secondary | ICD-10-CM

## 2021-05-03 DIAGNOSIS — R41840 Attention and concentration deficit: Secondary | ICD-10-CM

## 2021-05-03 DIAGNOSIS — F331 Major depressive disorder, recurrent, moderate: Secondary | ICD-10-CM

## 2021-05-04 NOTE — Telephone Encounter (Signed)
Refill request for Zoloft ?Last refill 03/03/20 #90/2 ?Last office visit video 12/26/19 ? ?Spoke to patient by telephone and advised her that she is over due to be seen. Appointment scheduled for 05/24/21 at 11:20 am. Patient stated that she took her last pill yesterday and requested a refill be sent in to last her until she can come for her appointment. ?

## 2021-05-24 ENCOUNTER — Encounter: Payer: Self-pay | Admitting: Primary Care

## 2021-05-24 ENCOUNTER — Ambulatory Visit (INDEPENDENT_AMBULATORY_CARE_PROVIDER_SITE_OTHER): Payer: 59 | Admitting: Primary Care

## 2021-05-24 VITALS — BP 110/64 | HR 88 | Temp 98.6°F | Ht 69.0 in | Wt 314.0 lb

## 2021-05-24 DIAGNOSIS — Z23 Encounter for immunization: Secondary | ICD-10-CM

## 2021-05-24 DIAGNOSIS — F411 Generalized anxiety disorder: Secondary | ICD-10-CM

## 2021-05-24 DIAGNOSIS — Z0000 Encounter for general adult medical examination without abnormal findings: Secondary | ICD-10-CM

## 2021-05-24 DIAGNOSIS — N926 Irregular menstruation, unspecified: Secondary | ICD-10-CM | POA: Insufficient documentation

## 2021-05-24 DIAGNOSIS — F331 Major depressive disorder, recurrent, moderate: Secondary | ICD-10-CM

## 2021-05-24 DIAGNOSIS — E282 Polycystic ovarian syndrome: Secondary | ICD-10-CM

## 2021-05-24 DIAGNOSIS — F330 Major depressive disorder, recurrent, mild: Secondary | ICD-10-CM

## 2021-05-24 DIAGNOSIS — R41840 Attention and concentration deficit: Secondary | ICD-10-CM

## 2021-05-24 MED ORDER — SERTRALINE HCL 100 MG PO TABS: 100.0000 mg | ORAL_TABLET | Freq: Every day | ORAL | 3 refills | Status: DC

## 2021-05-24 NOTE — Assessment & Plan Note (Addendum)
Deteriorated. ? ?Referral placed to therapy.  ? ?Continue Zoloft 100 mg daily. ?Refills provided.  ? ? ?

## 2021-05-24 NOTE — Assessment & Plan Note (Signed)
Tetanus due, provided today. ?Pap smear UTD per patient, follows with GYN. ? ?Discussed the importance of a healthy diet and regular exercise in order for weight loss, and to reduce the risk of further co-morbidity. ? ?Exam today stable. ? ?We will obtain records per GYN. ? ?

## 2021-05-24 NOTE — Progress Notes (Signed)
? ?Subjective:  ? ? Patient ID: Samantha Ramos, female    DOB: November 18, 1990, 31 y.o.   MRN: [id number] ? ?HPI ? ?Samantha Ramos is a very pleasant 31 y.o. female who presents today for complete physical and follow up of chronic conditions. ? ?She has noticed increased anxiety over the last 6 months. She is currently managed on Zoloft 100 mg daily and overall feels that she's experiencing benefits. Symptoms for anxiety include irritability, feeling nervous and irritable. She was exercising last year which helped with anxiety.  ? ?Immunizations: ?-Tetanus: 2013 ?-Influenza: Did not complete last season  ?-Covid-19: Has not completed ? ?Diet: Fair diet.  ?Exercise: No regular exercise. ? ?Eye exam: Completes annually  ?Dental exam: Completes semi-annually  ? ?Pap Smear: Completed per GYN.  ? ?BP Readings from Last 3 Encounters:  ?05/24/21 110/64  ?11/19/19 110/74  ?08/27/19 108/66  ? ? ? ? ? ? ? ?Review of Systems  ?Constitutional:  Negative for unexpected weight change.  ?HENT:  Negative for rhinorrhea.   ?Respiratory:  Negative for cough and shortness of breath.   ?Cardiovascular:  Negative for chest pain.  ?Gastrointestinal:  Negative for constipation and diarrhea.  ?Genitourinary:  Negative for difficulty urinating and menstrual problem.  ?Musculoskeletal:  Negative for arthralgias and myalgias.  ?Skin:  Negative for rash.  ?Allergic/Immunologic: Negative for environmental allergies.  ?Neurological:  Negative for dizziness and headaches.  ?Psychiatric/Behavioral:  The patient is nervous/anxious.   ? ?   ? ? ?Past Medical History:  ?Diagnosis Date  ? Allergy   ? Anxiety   ? Depression   ? Diabetes mellitus without complication ([place])   ? PCOS (polycystic ovarian syndrome)   ? ? ?Social History  ? ?Socioeconomic History  ? Marital status: Single  ?  Spouse name: Not on file  ? Number of children: Not on file  ? Years of education: Not on file  ? Highest education level: Not on file  ?Occupational History  ? Not on file   ?Tobacco Use  ? Smoking status: Never  ? Smokeless tobacco: Never  ?Substance and Sexual Activity  ? Alcohol use: Yes  ?  Alcohol/week: 0.0 standard drinks  ?  Comment: occ  ? Drug use: No  ? Sexual activity: Yes  ?  Birth control/protection: None  ?Other Topics Concern  ? Not on file  ?Social History Narrative  ?   ?   ? Diet: irregular eating, gatorade and chips at lunch, some fruits, and occ veggies  ? Parents divorced when she was 68, got some counseling then  ?   ?   ? Works as Electronics engineer at [employer]  ?   ? ?Social Determinants of Health  ? ?Financial Resource Strain: Not on file  ?Food Insecurity: Not on file  ?Transportation Needs: Not on file  ?Physical Activity: Not on file  ?Stress: Not on file  ?Social Connections: Not on file  ?Intimate Partner Violence: Not on file  ? ? ?Past Surgical History:  ?Procedure Laterality Date  ? DENTAL SURGERY    ? ? ?Family History  ?Problem Relation Age of Onset  ? Hypertension Mother   ? Hyperlipidemia Mother   ? Depression Mother   ? Cholelithiasis Mother   ? Thyroid disease Mother   ? Hypertension Father   ? Diabetes Father   ? Diabetes Maternal Grandmother   ? Heart disease Maternal Grandmother   ?     CVA  ? Diabetes Paternal Grandmother   ? Coronary artery disease  Paternal Grandfather   ? Cancer Cousin   ?     Leukemia  ? ? ?Allergies  ?Allergen Reactions  ? Penicillins   ?  REACTION: Rash  ? Codeine   ?  vomiting  ? ? ?Current Outpatient Medications on File Prior to Visit  ?Medication Sig Dispense Refill  ? norethindrone (MICRONOR,CAMILA,ERRIN) 0.35 MG tablet Take 1 tablet by mouth daily.     ? sertraline (ZOLOFT) 100 MG tablet TAKE 1 TABLET(100 MG) BY MOUTH DAILY FOR ANXIETY OR DEPRESSION 30 tablet 0  ? metFORMIN (GLUCOPHAGE) 500 MG tablet Take 1,000 mg by mouth 2 (two) times daily with a meal. (Patient not taking: Reported on 05/24/2021)    ? ?No current facility-administered medications on file prior to visit.  ? ? ?BP 110/64   Pulse 88   Temp 98.6 ?F (37  ?C) (Other (Comment))   Ht '5\' 9"'$  (1.753 m)   Wt (!) 314 lb (142.4 kg)   SpO2 97%   BMI 46.37 kg/m?  ?Objective:  ? Physical Exam ?HENT:  ?   Right Ear: Tympanic membrane and ear canal normal.  ?   Left Ear: Tympanic membrane and ear canal normal.  ?   Nose: Nose normal.  ?Eyes:  ?   Conjunctiva/sclera: Conjunctivae normal.  ?   Pupils: Pupils are equal, round, and reactive to light.  ?Neck:  ?   Thyroid: No thyromegaly.  ?Cardiovascular:  ?   Rate and Rhythm: Normal rate and regular rhythm.  ?   Heart sounds: No murmur heard. ?Pulmonary:  ?   Effort: Pulmonary effort is normal.  ?   Breath sounds: Normal breath sounds. No rales.  ?Abdominal:  ?   General: Bowel sounds are normal.  ?   Palpations: Abdomen is soft.  ?   Tenderness: There is no abdominal tenderness.  ?Musculoskeletal:     ?   General: Normal range of motion.  ?   Cervical back: Neck supple.  ?Lymphadenopathy:  ?   Cervical: No cervical adenopathy.  ?Skin: ?   General: Skin is warm and dry.  ?   Findings: No rash.  ?Neurological:  ?   Mental Status: She is alert and oriented to person, place, and time.  ?   Cranial Nerves: No cranial nerve deficit.  ?   Deep Tendon Reflexes: Reflexes are normal and symmetric.  ?Psychiatric:     ?   Mood and Affect: Mood normal.  ? ? ? ? ? ?   ?Assessment & Plan:  ? ? ? ? ?This visit occurred during the SARS-CoV-2 public health emergency.  Safety protocols were in place, including screening questions prior to the visit, additional usage of staff PPE, and extensive cleaning of exam room while observing appropriate contact time as indicated for disinfecting solutions.  ?

## 2021-05-24 NOTE — Assessment & Plan Note (Signed)
Following with GYN. ? ?Metformin 500 mg twice daily is causing GI upset, I recommended she ask her GYN for the XR version. ?

## 2021-05-24 NOTE — Patient Instructions (Addendum)
You will be contacted regarding your referral to therapy.  Please let us know if you have not been contacted within two weeks.   It was a pleasure to see you today!  Preventive Care 21-29 Years Old, Female Preventive care refers to lifestyle choices and visits with your health care provider that can promote health and wellness. Preventive care visits are also called wellness exams. What can I expect for my preventive care visit? Counseling During your preventive care visit, your health care provider may ask about your: Medical history, including: Past medical problems. Family medical history. Pregnancy history. Current health, including: Menstrual cycle. Method of birth control. Emotional well-being. Home life and relationship well-being. Sexual activity and sexual health. Lifestyle, including: Alcohol, nicotine or tobacco, and drug use. Access to firearms. Diet, exercise, and sleep habits. Work and work environment. Sunscreen use. Safety issues such as seatbelt and bike helmet use. Physical exam Your health care provider may check your: Height and weight. These may be used to calculate your BMI (body mass index). BMI is a measurement that tells if you are at a healthy weight. Waist circumference. This measures the distance around your waistline. This measurement also tells if you are at a healthy weight and may help predict your risk of certain diseases, such as type 2 diabetes and high blood pressure. Heart rate and blood pressure. Body temperature. Skin for abnormal spots. What immunizations do I need?  Vaccines are usually given at various ages, according to a schedule. Your health care provider will recommend vaccines for you based on your age, medical history, and lifestyle or other factors, such as travel or where you work. What tests do I need? Screening Your health care provider may recommend screening tests for certain conditions. This may include: Pelvic exam and Pap  test. Lipid and cholesterol levels. Diabetes screening. This is done by checking your blood sugar (glucose) after you have not eaten for a while (fasting). Hepatitis B test. Hepatitis C test. HIV (human immunodeficiency virus) test. STI (sexually transmitted infection) testing, if you are at risk. BRCA-related cancer screening. This may be done if you have a family history of breast, ovarian, tubal, or peritoneal cancers. Talk with your health care provider about your test results, treatment options, and if necessary, the need for more tests. Follow these instructions at home: Eating and drinking  Eat a healthy diet that includes fresh fruits and vegetables, whole grains, lean protein, and low-fat dairy products. Take vitamin and mineral supplements as recommended by your health care provider. Do not drink alcohol if: Your health care provider tells you not to drink. You are pregnant, may be pregnant, or are planning to become pregnant. If you drink alcohol: Limit how much you have to 0-1 drink a day. Know how much alcohol is in your drink. In the [place] one drink equals one 12 oz bottle of beer (355 mL), one 5 oz glass of wine (148 mL), or one 1 oz glass of hard liquor (44 mL). Lifestyle Brush your teeth every morning and night with fluoride toothpaste. Floss one time each day. Exercise for at least 30 minutes 5 or more days each week. Do not use any products that contain nicotine or tobacco. These products include cigarettes, chewing tobacco, and vaping devices, such as e-cigarettes. If you need help quitting, ask your health care provider. Do not use drugs. If you are sexually active, practice safe sex. Use a condom or other form of protection to prevent STIs. If you do not   wish to become pregnant, use a form of birth control. If you plan to become pregnant, see your health care provider for a prepregnancy visit. Find healthy ways to manage stress, such as: Meditation, yoga, or  listening to music. Journaling. Talking to a trusted person. Spending time with friends and family. Minimize exposure to UV radiation to reduce your risk of skin cancer. Safety Always wear your seat belt while driving or riding in a vehicle. Do not drive: If you have been drinking alcohol. Do not ride with someone who has been drinking. If you have been using any mind-altering substances or drugs. While texting. When you are tired or distracted. Wear a helmet and other protective equipment during sports activities. If you have firearms in your house, make sure you follow all gun safety procedures. Seek help if you have been physically or sexually abused. What's next? Go to your health care provider once a year for an annual wellness visit. Ask your health care provider how often you should have your eyes and teeth checked. Stay up to date on all vaccines. This information is not intended to replace advice given to you by your health care provider. Make sure you discuss any questions you have with your health care provider. Document Revised: 06/23/2020 Document Reviewed: 06/23/2020 Elsevier Patient Education  2023 Elsevier Inc.  

## 2021-05-24 NOTE — Assessment & Plan Note (Addendum)
Controlled per patient. ? ?Continue Zoloft 100 mg daily. ?Refills provided.  ? ?Continue to monitor.  ?

## 2021-05-24 NOTE — Assessment & Plan Note (Signed)
Following with GYN. ? ?Continue norethindrone 0.35 mg continuously ?

## 2021-05-25 NOTE — Addendum Note (Signed)
Addended by: Francella Solian on: 05/25/2021 08:29 AM ? ? Modules accepted: Orders ? ?

## 2021-10-26 ENCOUNTER — Other Ambulatory Visit: Payer: Self-pay | Admitting: Primary Care

## 2021-10-26 ENCOUNTER — Encounter: Payer: Self-pay | Admitting: Primary Care

## 2021-10-26 ENCOUNTER — Ambulatory Visit (INDEPENDENT_AMBULATORY_CARE_PROVIDER_SITE_OTHER): Payer: 59 | Admitting: Primary Care

## 2021-10-26 VITALS — BP 118/86 | HR 97 | Temp 97.2°F | Ht 69.0 in | Wt 333.0 lb

## 2021-10-26 DIAGNOSIS — R7303 Prediabetes: Secondary | ICD-10-CM | POA: Diagnosis not present

## 2021-10-26 DIAGNOSIS — E1165 Type 2 diabetes mellitus with hyperglycemia: Secondary | ICD-10-CM

## 2021-10-26 DIAGNOSIS — J309 Allergic rhinitis, unspecified: Secondary | ICD-10-CM

## 2021-10-26 DIAGNOSIS — R0981 Nasal congestion: Secondary | ICD-10-CM

## 2021-10-26 DIAGNOSIS — Z6841 Body Mass Index (BMI) 40.0 and over, adult: Secondary | ICD-10-CM

## 2021-10-26 LAB — CBC
HCT: 41.9 % (ref 36.0–46.0)
Hemoglobin: 13.6 g/dL (ref 12.0–15.0)
MCHC: 32.5 g/dL (ref 30.0–36.0)
MCV: 83.8 fl (ref 78.0–100.0)
Platelets: 273 10*3/uL (ref 150.0–400.0)
RBC: 5 Mil/uL (ref 3.87–5.11)
RDW: 14.3 % (ref 11.5–15.5)
WBC: 9.7 10*3/uL (ref 4.0–10.5)

## 2021-10-26 LAB — COMPREHENSIVE METABOLIC PANEL
ALT: 8 U/L (ref 0–35)
AST: 13 U/L (ref 0–37)
Albumin: 4 g/dL (ref 3.5–5.2)
Alkaline Phosphatase: 57 U/L (ref 39–117)
BUN: 9 mg/dL (ref 6–23)
CO2: 31 mEq/L (ref 19–32)
Calcium: 8.8 mg/dL (ref 8.4–10.5)
Chloride: 103 mEq/L (ref 96–112)
Creatinine, Ser: 0.69 mg/dL (ref 0.40–1.20)
GFR: 117.66 mL/min (ref >60.00)
Glucose, Bld: 78 mg/dL (ref 70–99)
Potassium: 4.3 mEq/L (ref 3.5–5.1)
Sodium: 139 mEq/L (ref 135–145)
Total Bilirubin: 0.7 mg/dL (ref 0.2–1.2)
Total Protein: 6.4 g/dL (ref 6.0–8.3)

## 2021-10-26 LAB — LIPID PANEL
Cholesterol: 125 mg/dL (ref 0–200)
HDL: 35.3 mg/dL — ABNORMAL LOW (ref >39.00)
LDL Cholesterol: 76 mg/dL (ref 0–99)
NonHDL: 89.23
Total CHOL/HDL Ratio: 4
Triglycerides: 65 mg/dL (ref 0.0–149.0)
VLDL: 13 mg/dL (ref 0.0–40.0)

## 2021-10-26 LAB — TSH: TSH: 1.37 u[IU]/mL (ref 0.35–5.50)

## 2021-10-26 LAB — POC COVID19 BINAXNOW: SARS Coronavirus 2 Ag: NEGATIVE

## 2021-10-26 LAB — HEMOGLOBIN A1C: Hgb A1c MFr Bld: 6.5 % (ref 4.6–6.5)

## 2021-10-26 MED ORDER — BLOOD GLUCOSE MONITOR KIT: PACK | 0 refills | Status: AC

## 2021-10-26 MED ORDER — METFORMIN HCL ER 500 MG PO TB24: 500.0000 mg | ORAL_TABLET | Freq: Every day | ORAL | 0 refills | Status: DC

## 2021-10-26 NOTE — Progress Notes (Signed)
Subjective:    Patient ID: Samantha Ramos, female    DOB: 10/24/1990, 31 y.o.   MRN: [id number]  HPI  Samantha Ramos is a very pleasant 31 y.o. female who presents today for follow up and to discuss nasal congestion.   1) Allergies/Nasal Congestion: Acute nasal congestion started yesterday with a foggy sensation to her overall head. This morning she developed post nasal drip. She denies cough. She's taken Alka Seltzer this morning. Overall feeling some better. She has not tested for Covid-19.  2) Morbid Obesity: Chronic for years with gradual weight gain over the years. She is eating fast food multiple times daily. Also drinks a Ramos of coffee with sugar. She knows a few people who are receiving weight loss injections and is interested.   Diet currently consists of:  Breakfast: Fast food, crackers, sometimes skips Lunch: Salad once weekly, fast food Dinner: Fast food, sandwich Snacks: Candy, crackers, cookies  Desserts: Daily  Beverages: Coffee, water, soda  Exercise: No regular exercise    Wt Readings from Last 3 Encounters:  10/26/21 (!) 333 lb (151 kg)  05/24/21 (!) 314 lb (142.4 kg)  11/19/19 300 lb (136.1 kg)   3) Prediabetes: Chronic for years with last A1C of 5.8 six years ago. She was previously managed on metformin and was up to 1000 mg BID. She's not taken metformin in >1 year.   She will notice symptoms of feeling jittery with weakness around 2-3 pm. Improved with eating. She drinks a large coffee with sugar every morning.   She denies polyuria, polydipsia, polyphagia.    BP Readings from Last 3 Encounters:  10/26/21 118/86  05/24/21 110/64  11/19/19 110/74      Review of Systems  Constitutional:  Negative for fatigue and fever.  HENT:  Positive for congestion and postnasal drip.   Respiratory:  Negative for cough.   Cardiovascular:  Negative for chest pain.  Neurological:  Positive for headaches.  Psychiatric/Behavioral:  The patient is not  nervous/anxious.          Past Medical History:  Diagnosis Date   Allergy    Anxiety    Depression    Diabetes mellitus without complication ([facility])    PCOS (polycystic ovarian syndrome)     Social History   Socioeconomic History   Marital status: Single    Spouse name: Not on file   Number of children: Not on file   Years of education: Not on file   Highest education level: Not on file  Occupational History   Not on file  Tobacco Use   Smoking status: Never   Smokeless tobacco: Never  Substance and Sexual Activity   Alcohol use: Yes    Alcohol/week: 0.0 standard drinks of alcohol    Comment: occ   Drug use: No   Sexual activity: Yes    Birth control/protection: None  Other Topics Concern   Not on file  Social History Narrative         Diet: irregular eating, gatorade and chips at lunch, some fruits, and occ veggies   Parents divorced when she was 65, got some counseling then         Works as Electronics engineer at [employer]      Social Determinants of Radio broadcast assistant Strain: Not on file  Food Insecurity: Not on file  Transportation Needs: Not on file  Physical Activity: Not on file  Stress: Not on file  Social Connections: Not on file  Intimate Partner  Violence: Not on file    Past Surgical History:  Procedure Laterality Date   DENTAL SURGERY      Family History  Problem Relation Age of Onset   Hypertension Mother    Hyperlipidemia Mother    Depression Mother    Cholelithiasis Mother    Thyroid disease Mother    Hypertension Father    Diabetes Father    Diabetes Maternal Grandmother    Heart disease Maternal Grandmother        CVA   Diabetes Paternal Grandmother    Coronary artery disease Paternal Grandfather    Cancer Cousin        Leukemia    Allergies  Allergen Reactions   Penicillins     REACTION: Rash   Codeine     vomiting    Current Outpatient Medications on File Prior to Visit  Medication Sig Dispense Refill    norethindrone (MICRONOR,CAMILA,ERRIN) 0.35 MG tablet Take 1 tablet by mouth daily.      sertraline (ZOLOFT) 100 MG tablet Take 1 tablet (100 mg total) by mouth daily. for anxiety and depression. 90 tablet 3   metFORMIN (GLUCOPHAGE) 500 MG tablet Take 1,000 mg by mouth 2 (two) times daily with a meal. (Patient not taking: Reported on 05/24/2021)     No current facility-administered medications on file prior to visit.    BP 118/86   Pulse 97   Temp (!) 97.2 F (36.2 C) (Temporal)   Ht '5\' 9"'$  (1.753 m)   Wt (!) 333 lb (151 kg)   SpO2 99%   BMI 49.18 kg/m  Objective:   Physical Exam Constitutional:      Appearance: She is not ill-appearing.  Cardiovascular:     Rate and Rhythm: Normal rate and regular rhythm.  Pulmonary:     Effort: Pulmonary effort is normal.     Breath sounds: Normal breath sounds.  Musculoskeletal:     Cervical back: Neck supple.  Skin:    General: Skin is warm and dry.  Psychiatric:        Mood and Affect: Mood normal.           Assessment & Plan:   Problem List Items Addressed This Visit       Respiratory   Allergic rhinitis    Suspect symptoms are secondary to allergy/environmental causes.  Negative Covid-19 test today. Discussed use of Flonase, Zyrtec.  Return precautions provided.         Other   Morbid obesity with BMI of 45.0-49.9, adult ([place])    Continued weight gain, surly secondary to unhealthy diet and lack of exercise.  Long discussion today regarding the absolute need to work on her diet, cut out fast food, start cooking at home, eliminating sodas, reducing caffeine, increase physical activity.   Checking labs today including A1C, TSH, CBC, CMP, Lipids  Defer weight loss treatment for now as I would like to see her work on her lifestyle first.  Continue to monitor.       Prediabetes    Repeat A1C pending today.  Suspect her afternoon symptoms are secondary to caffeine withdrawal as she drinks so much coffee in the  AM.  Long discussion today regarding the absolute need to work on her diet, cut out fast food, start cooking at home, eliminating sodas, reducing caffeine, increase physical activity.           Pleas Koch, NP

## 2021-10-26 NOTE — Assessment & Plan Note (Signed)
Suspect symptoms are secondary to allergy/environmental causes.  Negative Covid-19 test today. Discussed use of Flonase, Zyrtec.  Return precautions provided.

## 2021-10-26 NOTE — Addendum Note (Signed)
Addended by: Pat Kocher on: 10/26/2021 12:18 PM   Modules accepted: Orders

## 2021-10-26 NOTE — Assessment & Plan Note (Signed)
Repeat A1C pending today.  Suspect her afternoon symptoms are secondary to caffeine withdrawal as she drinks so much coffee in the AM.  Long discussion today regarding the absolute need to work on her diet, cut out fast food, start cooking at home, eliminating sodas, reducing caffeine, increase physical activity.

## 2021-10-26 NOTE — Assessment & Plan Note (Addendum)
Continued weight gain, surly secondary to unhealthy diet and lack of exercise.  Long discussion today regarding the absolute need to work on her diet, cut out fast food, start cooking at home, eliminating sodas, reducing caffeine, increase physical activity.   Checking labs today including A1C, TSH, CBC, CMP, Lipids  Defer weight loss treatment for now as I would like to see her work on her lifestyle first.  Continue to monitor.

## 2021-10-26 NOTE — Patient Instructions (Signed)
Stop eating fast food!  Stop drinking sodas.  Start exercising. You should be getting 150 minutes of moderate intensity exercise weekly.  Reduce caffeine consumption. Look up lower calorie options for [employer] and other fast food places.  Stop by the lab prior to leaving today. I will notify you of your results once received.   It was a pleasure to see you today!

## 2021-10-27 ENCOUNTER — Telehealth: Payer: Self-pay | Admitting: Primary Care

## 2021-10-27 NOTE — Telephone Encounter (Signed)
Pt call in regarding blood glucose meter kit and supplies KIT stated is was never called in to pharmacy. Requesting a call back .[phone]

## 2021-10-27 NOTE — Telephone Encounter (Signed)
Spoke with patient, I faxed over the order and received a confirmation. Patient will call pharmacy back and make them aware it came through as a fax.

## 2021-11-18 ENCOUNTER — Telehealth: Payer: Self-pay

## 2021-11-18 NOTE — Telephone Encounter (Signed)
[place] [facility] - Client TELEPHONE ADVICE RECORD [facility] Patient Name: Samantha Ramos [facility] Gender: Female DOB: January 22, 1990 Age: 31 Y 8 D Return Phone Number: [phone] (Primary) Address: [place]   [address] Client [place] [facility] - Client Client Site [place] - Day Provider Alma Friendly - NP Contact Type Call Who Is Calling Patient / Member / Family / Caregiver Call Type Triage / Clinical Relationship To Patient Self Return Phone Number [phone] (Primary) Chief Complaint Headache Reason for Call Symptomatic / Request for [place] states she has been having frequent headaches. Translation No Nurse Assessment Nurse: Thad Ranger, RN, Denise Date/Time (Eastern Time): 11/18/2021 9:08:59 AM Confirm and document reason for call. If symptomatic, describe symptoms. ---Caller states she has been having frequent headaches and has HA today. Does the patient have any new or worsening symptoms? ---Yes Will a triage be completed? ---Yes Related visit to physician within the last 2 weeks? ---No Does the PT have any chronic conditions? (i.e. diabetes, asthma, this includes High risk factors for pregnancy, etc.) ---No Is the patient pregnant or possibly pregnant? (Ask all females between the ages of 99-55) ---No Is this a behavioral health or substance abuse call? ---No Guidelines Guideline Title Affirmed Question Affirmed Notes Nurse Date/Time (Eastern Time) Headache [1] SEVERE headache (e.g., excruciating) AND [2] not improved after 2 hours of pain medicine Carmon, RN, Langley Gauss 11/18/2021 9:09:52 AM Disp. Time Eilene Ghazi Time) Disposition Final User 11/18/2021 8:58:41 AM Attempt made - message left Carmon, RN, Langley Gauss PLEASE NOTE: All timestamps contained within this report are represented as [place] Standard Time. CONFIDENTIALTY NOTICE: This fax  transmission is intended only for the addressee. It contains information that is legally privileged, confidential or otherwise protected from use or disclosure. If you are not the intended recipient, you are strictly prohibited from reviewing, disclosing, copying using or disseminating any of this information or taking any action in reliance on or regarding this information. If you have received this fax in error, please notify us immediately by telephone so that we can arrange for its return to [place]. Phone: [phone], Toll-Free: [phone], Fax: [phone] Page: 2 of 2 Call Id: [id number] [place]. Time Eilene Ghazi Time) Disposition Final User 11/18/2021 9:12:16 AM See HCP within 4 Hours (or PCP triage) Yes Thad Ranger, RN, Langley Gauss Final Disposition 11/18/2021 9:12:16 AM See HCP within 4 Hours (or PCP triage) Yes Carmon, RN, Yevette Edwards Disagree/Comply Comply Caller Understands Yes PreDisposition Call Doctor Care Advice Given Per Guideline SEE HCP (OR PCP TRIAGE) WITHIN 4 HOURS: PAIN MEDICINES: * ACETAMINOPHEN - EXTRA STRENGTH TYLENOL: Take 1,000 mg (two 500 mg pills) every 6 to 8 hours as needed. Each Extra Strength Tylenol pill has 500 mg of acetaminophen. The most you should take is 6 pills a day (3,000 mg total). Note: In [place], the maximum is 8 pills a day (4,000 mg total). * IBUPROFEN (E.G., MOTRIN, ADVIL): Take 400 mg (two 200 mg pills) by mouth every 6 hours. The most you should take is 6 pills a day (1,200 mg total). CALL BACK IF: * You become worse CARE ADVICE given per Headache (Adult) guideline. Comments User: Romeo Apple, RN Date/Time Eilene Ghazi Time): 11/18/2021 9:09:47 AM Hx of diabetes Referrals REFERRED TO PCP OFFIC

## 2021-11-18 NOTE — Telephone Encounter (Signed)
I spoke with pt and for over one month when pt wakes up has rt sided forehead h/a. No vision changes or dizziness pt said H/a pain level now is 4. Offered pt appt next wk but pt wants to see Gentry Fitz NP. Offered to schedule [facility] appt but pt said she wants to try tylenol and see if that will help. UC & ED precautions given and pt voiced understanding. Pt scheduled appt with Gentry Fitz NP on 11/23/21 at 12:20. Gentry Fitz NP is out of office and sending note to Red Christians FNP who is in office as FYI.

## 2021-11-18 NOTE — Telephone Encounter (Signed)
Agree with precautions given to pt  Agree with nurse assessment in plan.  Thank you for speaking with them. 

## 2021-11-23 ENCOUNTER — Other Ambulatory Visit: Payer: Self-pay | Admitting: Primary Care

## 2021-11-23 ENCOUNTER — Other Ambulatory Visit: Payer: Self-pay

## 2021-11-23 ENCOUNTER — Encounter (HOSPITAL_BASED_OUTPATIENT_CLINIC_OR_DEPARTMENT_OTHER): Payer: Self-pay | Admitting: Emergency Medicine

## 2021-11-23 ENCOUNTER — Encounter: Payer: Self-pay | Admitting: Primary Care

## 2021-11-23 ENCOUNTER — Ambulatory Visit (INDEPENDENT_AMBULATORY_CARE_PROVIDER_SITE_OTHER): Payer: 59 | Admitting: Primary Care

## 2021-11-23 ENCOUNTER — Emergency Department (HOSPITAL_BASED_OUTPATIENT_CLINIC_OR_DEPARTMENT_OTHER)
Admission: EM | Admit: 2021-11-23 | Discharge: 2021-11-23 | Disposition: A | Discharge status: Home / Self Care | Payer: 59 | Attending: Emergency Medicine | Admitting: Emergency Medicine

## 2021-11-23 VITALS — BP 130/82 | HR 95 | Temp 97.3°F | Ht 69.0 in | Wt 334.0 lb

## 2021-11-23 DIAGNOSIS — F331 Major depressive disorder, recurrent, moderate: Secondary | ICD-10-CM

## 2021-11-23 DIAGNOSIS — E1165 Type 2 diabetes mellitus with hyperglycemia: Secondary | ICD-10-CM

## 2021-11-23 DIAGNOSIS — E119 Type 2 diabetes mellitus without complications: Secondary | ICD-10-CM | POA: Diagnosis not present

## 2021-11-23 DIAGNOSIS — R55 Syncope and collapse: Secondary | ICD-10-CM | POA: Insufficient documentation

## 2021-11-23 DIAGNOSIS — R209 Unspecified disturbances of skin sensation: Secondary | ICD-10-CM | POA: Insufficient documentation

## 2021-11-23 DIAGNOSIS — F411 Generalized anxiety disorder: Secondary | ICD-10-CM | POA: Diagnosis not present

## 2021-11-23 DIAGNOSIS — G43009 Migraine without aura, not intractable, without status migrainosus: Secondary | ICD-10-CM

## 2021-11-23 LAB — CBC WITH DIFFERENTIAL/PLATELET
Abs Immature Granulocytes: 0.03 10*3/uL (ref 0.00–0.07)
Basophils Absolute: 0.1 10*3/uL (ref 0.0–0.1)
Basophils Relative: 1 %
Eosinophils Absolute: 0.3 10*3/uL (ref 0.0–0.5)
Eosinophils Relative: 2 %
HCT: 42.9 % (ref 36.0–46.0)
Hemoglobin: 13.6 g/dL (ref 12.0–15.0)
Immature Granulocytes: 0 %
Lymphocytes Relative: 26 %
Lymphs Abs: 3.3 10*3/uL (ref 0.7–4.0)
MCH: 26.8 pg (ref 26.0–34.0)
MCHC: 31.7 g/dL (ref 30.0–36.0)
MCV: 84.4 fL (ref 80.0–100.0)
Monocytes Absolute: 0.8 10*3/uL (ref 0.1–1.0)
Monocytes Relative: 7 %
Neutro Abs: 8.2 10*3/uL — ABNORMAL HIGH (ref 1.7–7.7)
Neutrophils Relative %: 64 %
Platelets: 334 10*3/uL (ref 150–400)
RBC: 5.08 MIL/uL (ref 3.87–5.11)
RDW: 14 % (ref 11.5–15.5)
WBC: 12.7 10*3/uL — ABNORMAL HIGH (ref 4.0–10.5)
nRBC: 0 % (ref 0.0–0.2)

## 2021-11-23 LAB — COMPREHENSIVE METABOLIC PANEL
ALT: 10 U/L (ref 0–44)
AST: 17 U/L (ref 15–41)
Albumin: 3.8 g/dL (ref 3.5–5.0)
Alkaline Phosphatase: 60 U/L (ref 38–126)
Anion gap: 8 (ref 5–15)
BUN: 11 mg/dL (ref 6–20)
CO2: 25 mmol/L (ref 22–32)
Calcium: 8.7 mg/dL — ABNORMAL LOW (ref 8.9–10.3)
Chloride: 102 mmol/L (ref 98–111)
Creatinine, Ser: 0.74 mg/dL (ref 0.44–1.00)
GFR, Estimated: >60 mL/min (ref >60)
Glucose, Bld: 89 mg/dL (ref 70–99)
Potassium: 3.7 mmol/L (ref 3.5–5.1)
Sodium: 135 mmol/L (ref 135–145)
Total Bilirubin: 0.7 mg/dL (ref 0.3–1.2)
Total Protein: 7.6 g/dL (ref 6.5–8.1)

## 2021-11-23 LAB — URINALYSIS, ROUTINE W REFLEX MICROSCOPIC
Bilirubin Urine: NEGATIVE
Glucose, UA: NEGATIVE mg/dL
Ketones, ur: NEGATIVE mg/dL
Nitrite: NEGATIVE
Protein, ur: NEGATIVE mg/dL
Specific Gravity, Urine: 1.015 (ref 1.005–1.030)
pH: 6.5 (ref 5.0–8.0)

## 2021-11-23 LAB — URINALYSIS, MICROSCOPIC (REFLEX)

## 2021-11-23 LAB — TROPONIN I (HIGH SENSITIVITY): Troponin I (High Sensitivity): <2 ng/L (ref <18)

## 2021-11-23 LAB — CBG MONITORING, ED: Glucose-Capillary: 92 mg/dL (ref 70–99)

## 2021-11-23 LAB — PREGNANCY, URINE: Preg Test, Ur: NEGATIVE

## 2021-11-23 MED ORDER — SUMATRIPTAN SUCCINATE 50 MG PO TABS: ORAL_TABLET | ORAL | 0 refills | Status: AC

## 2021-11-23 MED ORDER — PROPRANOLOL HCL ER 80 MG PO CP24: 80.0000 mg | ORAL_CAPSULE | Freq: Every day | ORAL | 0 refills | Status: DC

## 2021-11-23 NOTE — Assessment & Plan Note (Signed)
Returned. Also with frequent headaches.  Start propranolol ER 80 mg daily for headache prevention and anxiety. Start Imitrex 50 mg PRN for migraine abortion.  Close follow up in 1 month.

## 2021-11-23 NOTE — Progress Notes (Signed)
Subjective:    Patient ID: Samantha Ramos, female    DOB: 1990-07-15, 31 y.o.   MRN: [id number]  Migraine  Associated symptoms include photophobia. Pertinent negatives include no nausea.    Samantha Ramos is a very pleasant 31 y.o. female with a history of migraines, allergic rhinitis, prediabetes who presents today to discuss headaches and ongoing anxiety.  Return of migraines over the last two months which are located to the right frontal lobe, right occipital lobe, or left frontal lobe. Migraines are occurring 1-2 times weekly lasting hours to 1 day. Her last migraine was five days ago which was located to the left frontal lobe, woke up from sleep with the migraine. With migraines she experiences photophobia, phonophobia.   She's been under increased stress lately which she suspects as triggered her migraines. She's been treating her migraines with cold compresses, Ibuprofen, Tylenol, and sleeping.   History of migraines during her high school years, was on treatment at the time. Last migraine was during her late high school years. She does have a history general headaches that occur once monthly.   She continues to experience anxiety and depression symptoms which include tearfulness, feeling overwhelmed, constant worrying, little motivation. She is currently managed on Zoloft 100 mg daily. During her last visit she was referred to therapy, but she has not begun therapy as appointments were too far out. Historically, Zoloft has helped with symptoms.   Review of Systems  Eyes:  Positive for photophobia.  Gastrointestinal:  Negative for nausea.  Neurological:  Positive for headaches.  Psychiatric/Behavioral:  The patient is nervous/anxious.          Past Medical History:  Diagnosis Date   Allergy    Anxiety    Depression    Diabetes mellitus without complication (HCC)    PCOS (polycystic ovarian syndrome)     Social History   Socioeconomic History   Marital status: Single     Spouse name: Not on file   Number of children: Not on file   Years of education: Not on file   Highest education level: Not on file  Occupational History   Not on file  Tobacco Use   Smoking status: Never   Smokeless tobacco: Never  Substance and Sexual Activity   Alcohol use: Yes    Alcohol/week: 0.0 standard drinks of alcohol    Comment: occ   Drug use: No   Sexual activity: Yes    Birth control/protection: None  Other Topics Concern   Not on file  Social History Narrative         Diet: irregular eating, gatorade and chips at lunch, some fruits, and occ veggies   Parents divorced when she was 68, got some counseling then         Works as Electronics engineer at [employer]      Social Determinants of Radio broadcast assistant Strain: Not on file  Food Insecurity: Not on file  Transportation Needs: Not on file  Physical Activity: Not on file  Stress: Not on file  Social Connections: Not on file  Intimate Partner Violence: Not on file    Past Surgical History:  Procedure Laterality Date   DENTAL SURGERY      Family History  Problem Relation Age of Onset   Hypertension Mother    Hyperlipidemia Mother    Depression Mother    Cholelithiasis Mother    Thyroid disease Mother    Hypertension Father    Diabetes Father  Diabetes Maternal Grandmother    Heart disease Maternal Grandmother        CVA   Diabetes Paternal Grandmother    Coronary artery disease Paternal Grandfather    Cancer Cousin        Leukemia    Allergies  Allergen Reactions   Penicillins     REACTION: Rash   Codeine     vomiting    Current Outpatient Medications on File Prior to Visit  Medication Sig Dispense Refill   blood glucose meter kit and supplies KIT Dispense based on patient and insurance preference. Use up to four times daily as directed. (FOR ICD-9 250.00, 250.01). 1 each 0   metFORMIN (GLUCOPHAGE-XR) 500 MG 24 hr tablet Take 1 tablet (500 mg total) by mouth daily with breakfast.  for diabetes. 90 tablet 0   norethindrone (MICRONOR,CAMILA,ERRIN) 0.35 MG tablet Take 1 tablet by mouth daily.      sertraline (ZOLOFT) 100 MG tablet Take 1 tablet (100 mg total) by mouth daily. for anxiety and depression. 90 tablet 3   No current facility-administered medications on file prior to visit.    BP 130/82   Pulse 95   Temp (!) 97.3 F (36.3 C) (Temporal)   Ht 5' 9" (1.753 m)   Wt (!) 334 lb (151.5 kg)   SpO2 100%   BMI 49.32 kg/m  Objective:   Physical Exam Eyes:     Extraocular Movements: Extraocular movements intact.  Cardiovascular:     Rate and Rhythm: Normal rate and regular rhythm.  Pulmonary:     Effort: Pulmonary effort is normal.     Breath sounds: Normal breath sounds.  Musculoskeletal:     Cervical back: Neck supple.  Skin:    General: Skin is warm and dry.  Neurological:     Mental Status: She is oriented to person, place, and time.     Cranial Nerves: No cranial nerve deficit.           Assessment & Plan:   Problem List Items Addressed This Visit       Cardiovascular and Mediastinum   Migraines - Primary    Returned. Also with frequent headaches.  Start propranolol ER 80 mg daily for headache prevention and anxiety. Start Imitrex 50 mg PRN for migraine abortion.  Close follow up in 1 month.      Relevant Medications   propranolol ER (INDERAL LA) 80 MG 24 hr capsule   SUMAtriptan (IMITREX) 50 MG tablet     Other   Generalized anxiety disorder    Remains uncontrolled.  Referral placed back for therapy.   Continue Zoloft 100 mg daily. Add propranolol ER 80 mg daily for anxiety and headache prevention.  Follow up in 1 month.      Relevant Medications   propranolol ER (INDERAL LA) 80 MG 24 hr capsule   Other Relevant Orders   Ambulatory referral to Psychology   MDD (major depressive disorder)   Relevant Orders   Ambulatory referral to Psychology       Pleas Koch, NP

## 2021-11-23 NOTE — ED Provider Notes (Signed)
Emergency Department Provider Note   I have reviewed the triage vital signs and the nursing notes.   HISTORY  Chief Complaint No chief complaint on file.   HPI Samantha Ramos is a 31 y.o. female presents to the emergency department with lightheadedness with ambulation and tingling in the lips.  Symptoms have been progressive over the past 2 weeks.  No specific numbness or weakness.  She feels lightheaded with standing but reports drinking water frequently. No new medications. No persistent numbness. Numbness/tingling is perioral. No vision disturbance. No syncope, palpations, CP, or SOB.   Past Medical History:  Diagnosis Date   Allergy    Anxiety    Depression    Diabetes mellitus without complication (HCC)    PCOS (polycystic ovarian syndrome)     Review of Systems  Constitutional: No fever/chills. Positive lightheadedness.  Cardiovascular: Denies chest pain. Respiratory: Denies shortness of breath. Gastrointestinal: No abdominal pain.  No nausea, no vomiting.  No diarrhea.  No constipation. Genitourinary: Negative for dysuria. Musculoskeletal: Negative for back pain. Skin: Negative for rash. Neurological: Negative for headaches, focal weakness or numbness. Positive perioral tingling (intermittent).    ____________________________________________   PHYSICAL EXAM:  VITAL SIGNS: ED Triage Vitals  Enc Vitals Group     BP 11/23/21 2143 (!) 153/107     Pulse Rate 11/23/21 2143 91     Resp 11/23/21 2143 20     Temp 11/23/21 2143 98.2 F (36.8 C)     Temp src --      SpO2 11/23/21 2143 97 %     Weight 11/23/21 2142 (!) 334 lb (151.5 kg)     Height 11/23/21 2142 '5\' 9"'$  (1.753 m)   Constitutional: Alert and oriented. Well appearing and in no acute distress. Eyes: Conjunctivae are normal. PERRL Head: Atraumatic. Nose: No congestion/rhinnorhea. Mouth/Throat: Mucous membranes are moist.  Oropharynx non-erythematous. No angioedema.  Neck: No stridor.    Cardiovascular: Normal rate, regular rhythm. Good peripheral circulation. Grossly normal heart sounds.   Respiratory: Normal respiratory effort.  No retractions. Lungs CTAB. Gastrointestinal: Soft and nontender. No distention.  Musculoskeletal: No lower extremity tenderness nor edema. No gross deformities of extremities. Neurologic:  Normal speech and language. No gross focal neurologic deficits are appreciated.  Skin:  Skin is warm, dry and intact. No rash noted.  ____________________________________________   LABS (all labs ordered are listed, but only abnormal results are displayed)  Labs Reviewed  URINALYSIS, ROUTINE W REFLEX MICROSCOPIC - Abnormal; Notable for the following components:      Result Value   APPearance HAZY (*)    Hgb urine dipstick TRACE (*)    Leukocytes,Ua LARGE (*)    All other components within normal limits  COMPREHENSIVE METABOLIC PANEL - Abnormal; Notable for the following components:   Calcium 8.7 (*)    All other components within normal limits  CBC WITH DIFFERENTIAL/PLATELET - Abnormal; Notable for the following components:   WBC 12.7 (*)    Neutro Abs 8.2 (*)    All other components within normal limits  URINALYSIS, MICROSCOPIC (REFLEX) - Abnormal; Notable for the following components:   Bacteria, UA MANY (*)    Non Squamous Epithelial PRESENT (*)    All other components within normal limits  PREGNANCY, URINE  CBG MONITORING, ED  TROPONIN I (HIGH SENSITIVITY)   ____________________________________________  EKG   EKG Interpretation  Date/Time:  Wednesday November 23 2021 22:27:25 EST Ventricular Rate:  84 PR Interval:  163 QRS Duration: 92 QT Interval:  374  QTC Calculation: 443 R Axis:   74 Text Interpretation: Sinus rhythm Confirmed by Nanda Quinton [id number]) on 11/23/2021 10:43:58 PM        ____________________________________________   PROCEDURES  Procedure(s) performed:   Procedures  None   ____________________________________________   INITIAL IMPRESSION / ASSESSMENT AND PLAN / ED COURSE  Pertinent labs & imaging results that were available during my care of the patient were reviewed by me and considered in my medical decision making (see chart for details).   This patient is Presenting for Evaluation of lightheadedness, which does require a range of treatment options, and is a complaint that involves a high risk of morbidity and mortality.  The Differential Diagnoses include dehydration, anemia, electrolyte disturbance, CVA, MS, etc.   I did obtain Additional Historical Information from friend at bedside.   Clinical Laboratory Tests Ordered, included troponin which is negative. Leukocytosis noted without anemia. UA with bacteria and leukocytes but squamous cells as well. No lower abdominal pain or UTI symptoms. Pregnancy negative.   Radiologic Tests considered neuro imaging but tingling is intermittent and mainly perioral. No clear indication for MRI on an emergent basis.   Cardiac Monitor Tracing which shows NSR.    Social Determinants of Health Risk no smoking history.   Medical Decision Making: Summary:  Patient presents to the emergency department for evaluation of perioral tingling and lightheadedness with standing.  Orthostatics show hypertension but no drop in blood pressure elevated heart rate.  Patient feeling much better after IV fluids.  Equivocal UA with squamous cells.  No UTI symptoms.  Defer antibiotic treatment for now.  Reevaluation with update and discussion with patient. Discussed labs. Plan for close PCP follow up. Discussed strict ED return precautions.   Disposition: discharge  ____________________________________________  FINAL CLINICAL IMPRESSION(S) / ED DIAGNOSES  Final diagnoses:  Near syncope    Note:  This document was prepared using Dragon voice recognition software and may include unintentional dictation errors.  Nanda Quinton, MD,  [facility]    Adryen Cookson, Wonda Old[facility], MD 11/25/21 Reece Agar

## 2021-11-23 NOTE — Assessment & Plan Note (Addendum)
Remains uncontrolled.  Referral placed back for therapy.   Continue Zoloft 100 mg daily. Add propranolol ER 80 mg daily for anxiety and headache prevention.  Follow up in 1 month.

## 2021-11-23 NOTE — Patient Instructions (Signed)
Start propranolol ER 80 mg nightly for headache prevention and anxiety.   You may take Imitrex 50 mg at migraine onset. You may repeat with a second tablet 2 hours later if migraine persists.   Follow up with therapy as discussed.  Please schedule a follow up visit for 1 month. This can be virtual.   It was a pleasure to see you today!

## 2021-11-23 NOTE — Discharge Instructions (Signed)
Please follow closely with your PCP in the coming week. Return with any new or suddenly worsening symptoms.

## 2021-11-23 NOTE — ED Triage Notes (Signed)
Dizziness and lightheadedness with walking for 2 weeks. Also reports numbness/tingling around her mouth.

## 2021-11-23 NOTE — ED Notes (Signed)
ED Provider at bedside. 

## 2022-01-25 ENCOUNTER — Other Ambulatory Visit: Payer: Self-pay | Admitting: Primary Care

## 2022-01-25 DIAGNOSIS — E1165 Type 2 diabetes mellitus with hyperglycemia: Secondary | ICD-10-CM

## 2022-01-26 ENCOUNTER — Telehealth: Payer: Self-pay | Admitting: Primary Care

## 2022-01-26 DIAGNOSIS — G43009 Migraine without aura, not intractable, without status migrainosus: Secondary | ICD-10-CM

## 2022-01-26 DIAGNOSIS — F411 Generalized anxiety disorder: Secondary | ICD-10-CM

## 2022-01-26 MED ORDER — PROPRANOLOL HCL ER 80 MG PO CP24: 80.0000 mg | ORAL_CAPSULE | Freq: Every day | ORAL | 0 refills | Status: DC

## 2022-01-26 NOTE — Telephone Encounter (Signed)
Prescription Request  01/26/2022  Is this a "Controlled Substance" medicine? No  LOV: 11/23/2021  What is the name of the medication or equipment? propranolol ER (INDERAL LA) 80 MG 24 hr capsule   Have you contacted your pharmacy to request a refill? No   Which pharmacy would you like this sent to?  [id number] - [place], [place] - [address] [place] [address] Phone: [phone] Fax: [phone]     Patient notified that their request is being sent to the clinical staff for review and that they should receive a response within 2 business days.   Please advise at Mobile [phone] (mobile)

## 2022-01-26 NOTE — Telephone Encounter (Signed)
Patient has been scheduled

## 2022-01-26 NOTE — Addendum Note (Signed)
Addended by: Pleas Koch on: 01/26/2022 10:08 AM   Modules accepted: Orders

## 2022-01-26 NOTE — Telephone Encounter (Signed)
Patient is due for diabetes follow up, this will be required prior to any further refills.  Please schedule, thank you!

## 2022-01-26 NOTE — Telephone Encounter (Signed)
Refills sent to pharmacy. 

## 2022-01-30 DIAGNOSIS — G43009 Migraine without aura, not intractable, without status migrainosus: Secondary | ICD-10-CM

## 2022-01-30 DIAGNOSIS — F411 Generalized anxiety disorder: Secondary | ICD-10-CM

## 2022-01-31 ENCOUNTER — Other Ambulatory Visit: Payer: Self-pay | Admitting: Primary Care

## 2022-01-31 DIAGNOSIS — G43009 Migraine without aura, not intractable, without status migrainosus: Secondary | ICD-10-CM

## 2022-01-31 MED ORDER — PROPRANOLOL HCL ER 80 MG PO CP24: 80.0000 mg | ORAL_CAPSULE | Freq: Every day | ORAL | 1 refills | Status: DC

## 2022-01-31 MED ORDER — PROPRANOLOL HCL ER 80 MG PO CP24: 80.0000 mg | ORAL_CAPSULE | Freq: Every day | ORAL | 0 refills | Status: DC

## 2022-02-24 ENCOUNTER — Ambulatory Visit: Payer: 59 | Admitting: Primary Care

## 2022-02-25 ENCOUNTER — Other Ambulatory Visit: Payer: Self-pay | Admitting: Primary Care

## 2022-02-25 DIAGNOSIS — E1165 Type 2 diabetes mellitus with hyperglycemia: Secondary | ICD-10-CM

## 2022-03-01 ENCOUNTER — Encounter: Payer: Self-pay | Admitting: Primary Care

## 2022-03-01 ENCOUNTER — Ambulatory Visit (INDEPENDENT_AMBULATORY_CARE_PROVIDER_SITE_OTHER): Payer: 59 | Admitting: Primary Care

## 2022-03-01 VITALS — BP 118/74 | HR 67 | Temp 97.0°F | Ht 69.0 in | Wt 342.0 lb

## 2022-03-01 DIAGNOSIS — G43009 Migraine without aura, not intractable, without status migrainosus: Secondary | ICD-10-CM | POA: Diagnosis not present

## 2022-03-01 DIAGNOSIS — E1165 Type 2 diabetes mellitus with hyperglycemia: Secondary | ICD-10-CM | POA: Diagnosis not present

## 2022-03-01 DIAGNOSIS — F411 Generalized anxiety disorder: Secondary | ICD-10-CM | POA: Diagnosis not present

## 2022-03-01 LAB — POCT GLYCOSYLATED HEMOGLOBIN (HGB A1C): Hemoglobin A1C: 6.3 % — AB (ref 4.0–5.6)

## 2022-03-01 MED ORDER — SERTRALINE HCL 25 MG PO TABS: 25.0000 mg | ORAL_TABLET | Freq: Every day | ORAL | 0 refills | Status: DC

## 2022-03-01 MED ORDER — OZEMPIC (0.25 OR 0.5 MG/DOSE) 2 MG/3ML ~~LOC~~ SOPN: PEN_INJECTOR | SUBCUTANEOUS | 0 refills | Status: DC

## 2022-03-01 NOTE — Patient Instructions (Signed)
We increased your dose of Zoloft to 125 mg daily for anxiety. Take the 25 mg and 100 mg tablet together.  Start semaglutide (Ozempic) for diabetes/weight loss. Start by injecting 0.25 mg into the skin once weekly for 4 weeks, then increase to 0.5 mg once weekly thereafter.   Please schedule a follow up visit for 3 months.  It was a pleasure to see you today!

## 2022-03-01 NOTE — Progress Notes (Signed)
Subjective:    Patient ID: Samantha Ramos, female    DOB: 1990/12/29, 32 y.o.   MRN: [id number]  HPI  Samantha Ramos is a very pleasant 32 y.o. female with a history of migraines, GAD, MDD, new onset of type 2 diabetes who presents today for follow up of diabetes, headaches, and anxiety.  1) Type 2 Diabetes: Current medications include: metformin XR 500 mg daily  She is checking her blood glucose 2 times daily and is getting readings of:  AM fasting: 80's-90's Bedtime: 80's-90's.   Episodes of shakes and feeling jittery 2-3 times weekly with glucose readings of 60's-70's. She feels so bad that she has to eat a snack. This does disrupt her work day. She has tried plain metformin in the past which causes GI upset.   Last A1C: 6.5 in October 2023,  Last Eye Exam: UTD Last Foot Exam: Due Pneumonia Vaccination: Defer Urine Microalbumin: Due, declines today Statin: None. Childbearing age.  Dietary changes since last visit: Cutting back on sodas, less take out food and fried food.    Exercise: None.   BP Readings from Last 3 Encounters:  03/01/22 118/74  11/23/21 (!) 153/116  11/23/21 130/82    Wt Readings from Last 3 Encounters:  03/01/22 (!) 342 lb (155.1 kg)  11/23/21 (!) 334 lb (151.5 kg)  11/23/21 (!) 334 lb (151.5 kg)   2) Migraines/Frequent Headaches: Currently prescribed propranolol ER 80 mg daily for headache prevention and sumatriptan 50 mg PRN for abortion. This regimen was initiated in November 2023 for recurrent headaches and migraines.   Since her last visit her headaches have significantly improved! She has only had one migraine since her last visit, took sumatriptan which helped.   3) GAD: Currently prescribed Zoloft 100 mg daily and propranolol ER 80 mg HS. She continues to experience ongoing anxiety with symptoms of worry and feeling overwhelmed. Her anxiety did decrease some with propranolol, mostly at night. She does feel that Zoloft helps. She is working to  make a therapy appointment.   Review of Systems  Gastrointestinal:  Negative for abdominal pain and diarrhea.  Endocrine: Positive for polydipsia. Negative for polyphagia and polyuria.  Neurological:  Negative for dizziness.         Past Medical History:  Diagnosis Date   Allergy    Anxiety    Depression    Diabetes mellitus without complication (HCC)    PCOS (polycystic ovarian syndrome)     Social History   Socioeconomic History   Marital status: Single    Spouse name: Not on file   Number of children: Not on file   Years of education: Not on file   Highest education level: Not on file  Occupational History   Not on file  Tobacco Use   Smoking status: Never   Smokeless tobacco: Never  Substance and Sexual Activity   Alcohol use: Yes    Alcohol/week: 0.0 standard drinks of alcohol    Comment: occ   Drug use: No   Sexual activity: Yes    Birth control/protection: None  Other Topics Concern   Not on file  Social History Narrative         Diet: irregular eating, gatorade and chips at lunch, some fruits, and occ veggies   Parents divorced when she was 84, got some counseling then         Works as Electronics engineer at [employer]      Social Determinants of Radio broadcast assistant Strain:  Not on file  Food Insecurity: Not on file  Transportation Needs: Not on file  Physical Activity: Not on file  Stress: Not on file  Social Connections: Not on file  Intimate Partner Violence: Not on file    Past Surgical History:  Procedure Laterality Date   DENTAL SURGERY      Family History  Problem Relation Age of Onset   Hypertension Mother    Hyperlipidemia Mother    Depression Mother    Cholelithiasis Mother    Thyroid disease Mother    Hypertension Father    Diabetes Father    Diabetes Maternal Grandmother    Heart disease Maternal Grandmother        CVA   Diabetes Paternal Grandmother    Coronary artery disease Paternal Grandfather    Cancer Cousin         Leukemia    Allergies  Allergen Reactions   Penicillins     REACTION: Rash   Codeine     vomiting    Current Outpatient Medications on File Prior to Visit  Medication Sig Dispense Refill   blood glucose meter kit and supplies KIT Dispense based on patient and insurance preference. Use up to four times daily as directed. (FOR ICD-9 250.00, 250.01). 1 each 0   norethindrone (MICRONOR,CAMILA,ERRIN) 0.35 MG tablet Take 1 tablet by mouth daily.      propranolol ER (INDERAL LA) 80 MG 24 hr capsule Take 1 capsule (80 mg total) by mouth at bedtime. For headaches 90 capsule 1   propranolol ER (INDERAL LA) 80 MG 24 hr capsule Take 1 capsule (80 mg total) by mouth at bedtime. For headache prevention 30 capsule 0   sertraline (ZOLOFT) 100 MG tablet Take 1 tablet (100 mg total) by mouth daily. for anxiety and depression. 90 tablet 3   SUMAtriptan (IMITREX) 50 MG tablet Take 1 tablet by mouth at migraine onset. May repeat in 2 hours if headache persists or recurs. 10 tablet 0   No current facility-administered medications on file prior to visit.    BP 118/74   Pulse 67   Temp (!) 97 F (36.1 C) (Temporal)   Ht 5' 9"$  (1.753 m)   Wt (!) 342 lb (155.1 kg)   SpO2 100%   BMI 50.50 kg/m  Objective:   Physical Exam Cardiovascular:     Rate and Rhythm: Normal rate and regular rhythm.  Pulmonary:     Effort: Pulmonary effort is normal.     Breath sounds: Normal breath sounds.  Musculoskeletal:     Cervical back: Neck supple.  Skin:    General: Skin is warm and dry.           Assessment & Plan:  Type 2 diabetes mellitus with hyperglycemia, without long-term current use of insulin (HCC) Assessment & Plan: Controlled and improved with hemoglobin A1C reading of 6.3 today.   Discussed hypoglycemic symptoms with patient and asked to report consistent readings below 70.  Given symptoms on metformin, will discontinue metformin and switch to Ozempic. Continue Metformin XR 500 mg daily  for now until she receives Ozempic.   Start Ozempic 0.25 mg inject once weekly for four weeks and increase to 0.5 mg thereafter. Rx sent.  Foot exam completed  Pneumonia vaccine declined  Urine ACR declined  Statin- Child bearing age.  Discussed the importance of continuing to monitor her diet and start exercising.   Follow up in 3 months.   I evaluated patient, was consulted regarding treatment, and  agree with assessment and plan per Tinnie Gens, RN, DNP student.   Allie Bossier, NP-C   Orders: -     POCT glycosylated hemoglobin (Hb A1C) -     Ozempic (0.25 or 0.5 MG/DOSE); Inject 0.25 mg into the skin once weekly for 4 weeks, then increase to 0.5 mg once weekly thereafter for diabetes.  Dispense: 9 mL; Refill: 0  Generalized anxiety disorder Assessment & Plan: Uncontrolled, but Zoloft is effective.  Increase Zoloft to 125 mg daily. She will schedule with therapy.  Follow up in 3 months.  Orders: -     Sertraline HCl; Take 1 tablet (25 mg total) by mouth daily. For anxiety. Take with 100 mg dose.  Dispense: 90 tablet; Refill: 0  Migraine without aura and without status migrainosus, not intractable Assessment & Plan: Controlled and improved.  Continue propranolol ER 80 mg daily for prevention. Continue sumatriptan 50 mg PRN.         Pleas Koch, NP

## 2022-03-01 NOTE — Progress Notes (Signed)
Established Patient Office Visit  Subjective   Patient ID: Samantha Ramos, female    DOB: April 29, 1990  Age: 32 y.o. MRN: [id number]  Chief Complaint  Patient presents with   Medical Management of Chronic Issues    diabetes    HPI  Samantha Ramos is a 32 year old female with past medical history of migraines, PCOS, obesity, MDD, GAD, type 2 diabetes presents today for a follow up for diabetes   Current medications include: Metformin XR 500 mg one tablet daily.   She is checking her blood glucose 2 times daily and is getting readings of 80-90s (fasting) and 80s-90s at bedtime. For about 2-3 times a week, she does experience shakiness, lightheadedness, dizziness around mid afternoon which is generally resolved with a snack. She checks her blood sugar during her episodes and those readings have been between 60-72.   Last A1C: 6.5 in October, 2023 Last Eye Exam: Completed  Last Foot Exam: Due Pneumonia Vaccination: Due; declines today Urine Microalbumin: Due; declines today Statin: child bearing age.   Dietary changes since last visit: She has cut back on her sodas and only drinks one 12 oz can a day. She has been trying to consume more home cooked meals. She tries to avoid fried foods. She is working on portion control.   Exercise: no regular exercise.    Patient Active Problem List   Diagnosis Date Noted   Type 2 diabetes mellitus with hyperglycemia, without long-term current use of insulin ([place]) 10/26/2021   Irregular menses 05/24/2021   Polycystic ovarian syndrome 05/24/2021   Difficulty concentrating 11/19/2019   Preventative health care 02/16/2015   VITAMIN D DEFICIENCY 03/19/2009   Allergic rhinitis 05/06/2008   DYSPLASTIC NEVUS, BACK 06/13/2007   Generalized anxiety disorder 05/10/2007   Morbid obesity with BMI of 45.0-49.9, adult ([place]) 02/14/2007   MDD (major depressive disorder) 02/14/2007   Migraines 02/14/2007   Past Medical History:  Diagnosis Date   Allergy     Anxiety    Depression    Diabetes mellitus without complication (HCC)    PCOS (polycystic ovarian syndrome)    Past Surgical History:  Procedure Laterality Date   DENTAL SURGERY     Social History   Tobacco Use   Smoking status: Never   Smokeless tobacco: Never  Substance Use Topics   Alcohol use: Yes    Alcohol/week: 0.0 standard drinks of alcohol    Comment: occ   Drug use: No   Social History   Socioeconomic History   Marital status: Single    Spouse name: Not on file   Number of children: Not on file   Years of education: Not on file   Highest education level: Not on file  Occupational History   Not on file  Tobacco Use   Smoking status: Never   Smokeless tobacco: Never  Substance and Sexual Activity   Alcohol use: Yes    Alcohol/week: 0.0 standard drinks of alcohol    Comment: occ   Drug use: No   Sexual activity: Yes    Birth control/protection: None  Other Topics Concern   Not on file  Social History Narrative         Diet: irregular eating, gatorade and chips at lunch, some fruits, and occ veggies   Parents divorced when she was 3, got some counseling then         Works as Electronics engineer at [employer]      Social Determinants of Engineer, drilling  Resource Strain: Not on file  Food Insecurity: Not on file  Transportation Needs: Not on file  Physical Activity: Not on file  Stress: Not on file  Social Connections: Not on file  Intimate Partner Violence: Not on file   Family History  Problem Relation Age of Onset   Hypertension Mother    Hyperlipidemia Mother    Depression Mother    Cholelithiasis Mother    Thyroid disease Mother    Hypertension Father    Diabetes Father    Diabetes Maternal Grandmother    Heart disease Maternal Grandmother        CVA   Diabetes Paternal Grandmother    Coronary artery disease Paternal Grandfather    Cancer Cousin        Leukemia   Allergies  Allergen Reactions   Penicillins     REACTION: Rash    Codeine     vomiting      Review of Systems  Respiratory:  Negative for shortness of breath.   Cardiovascular:  Negative for chest pain.  Gastrointestinal:  Negative for nausea and vomiting.  Genitourinary:  Negative for frequency.  Neurological:  Negative for dizziness and headaches.  Endo/Heme/Allergies:  Positive for polydipsia.      Objective:     BP 118/74   Pulse 67   Temp (!) 97 F (36.1 C) (Temporal)   Ht 5' 9"$  (1.753 m)   Wt (!) 342 lb (155.1 kg)   SpO2 100%   BMI 50.50 kg/m  BP Readings from Last 3 Encounters:  03/01/22 118/74  11/23/21 (!) 153/116  11/23/21 130/82   Wt Readings from Last 3 Encounters:  03/01/22 (!) 342 lb (155.1 kg)  11/23/21 (!) 334 lb (151.5 kg)  11/23/21 (!) 334 lb (151.5 kg)      Physical Exam Vitals and nursing note reviewed.  Constitutional:      Appearance: Normal appearance.  Cardiovascular:     Rate and Rhythm: Normal rate and regular rhythm.     Pulses: Normal pulses.     Heart sounds: Normal heart sounds.  Pulmonary:     Effort: Pulmonary effort is normal.     Breath sounds: Normal breath sounds.  Neurological:     Mental Status: She is alert and oriented to person, place, and time.  Psychiatric:        Mood and Affect: Mood normal.        Behavior: Behavior normal.      Results for orders placed or performed in visit on 03/01/22  POCT glycosylated hemoglobin (Hb A1C)  Result Value Ref Range   Hemoglobin A1C 6.3 (A) 4.0 - 5.6 %   HbA1c POC (<> result, manual entry)     HbA1c, POC (prediabetic range)     HbA1c, POC (controlled diabetic range)         The ASCVD Risk score (Arnett DK, et al., 2019) failed to calculate for the following reasons:   The 2019 ASCVD risk score is only valid for ages 56 to 40    Assessment & Plan:   Problem List Items Addressed This Visit       Endocrine   Type 2 diabetes mellitus with hyperglycemia, without long-term current use of insulin (HCC) - Primary    Controlled  with hemoglobin A1C reading of 6.3 today.   Discussed hypoglycemic symptoms with patient and asked to report consistent readings below 70.   Continue Metformin XR 500 mg daily for now until she receives Ozempic.  Start Ozempic  2.5 mg inject once weekly for four weeks and increase to 0.5 mg thereafter. Rx sent.  Foot exam completed  Pneumonia vaccine declined  Urine ACR declined  Statin- Child bearing age.  Discussed the importance of continuing to monitor her diet and start exercising.   Follow up in 3 months.       Relevant Medications   Semaglutide,0.25 or 0.5MG/DOS, (OZEMPIC, 0.25 OR 0.5 MG/DOSE,) 2 MG/3ML SOPN   Other Relevant Orders   POCT glycosylated hemoglobin (Hb A1C) (Completed)     Other   Generalized anxiety disorder   Relevant Medications   sertraline (ZOLOFT) 25 MG tablet    Return in about 3 months (around 05/30/2022) for anxiety and diabetes.    Tinnie Gens, BSN-RN, DNP STUDENT

## 2022-03-01 NOTE — Assessment & Plan Note (Signed)
Uncontrolled, but Zoloft is effective.  Increase Zoloft to 125 mg daily. She will schedule with therapy.  Follow up in 3 months.

## 2022-03-01 NOTE — Assessment & Plan Note (Addendum)
Controlled and improved with hemoglobin A1C reading of 6.3 today.   Discussed hypoglycemic symptoms with patient and asked to report consistent readings below 70.  Given symptoms on metformin, will discontinue metformin and switch to Ozempic. Continue Metformin XR 500 mg daily for now until she receives Ozempic.   Start Ozempic 0.25 mg inject once weekly for four weeks and increase to 0.5 mg thereafter. Rx sent.  Foot exam completed  Pneumonia vaccine declined  Urine ACR declined  Statin- Child bearing age.  Discussed the importance of continuing to monitor her diet and start exercising.   Follow up in 3 months.   I evaluated patient, was consulted regarding treatment, and agree with assessment and plan per Tinnie Gens, RN, DNP student.   Allie Bossier, NP-C

## 2022-03-01 NOTE — Assessment & Plan Note (Signed)
Controlled and improved.  Continue propranolol ER 80 mg daily for prevention. Continue sumatriptan 50 mg PRN.

## 2022-04-03 ENCOUNTER — Other Ambulatory Visit: Payer: Self-pay | Admitting: Primary Care

## 2022-04-03 DIAGNOSIS — R41840 Attention and concentration deficit: Secondary | ICD-10-CM

## 2022-04-03 DIAGNOSIS — F331 Major depressive disorder, recurrent, moderate: Secondary | ICD-10-CM

## 2022-04-03 DIAGNOSIS — F411 Generalized anxiety disorder: Secondary | ICD-10-CM

## 2022-04-12 ENCOUNTER — Other Ambulatory Visit: Payer: Self-pay | Admitting: Primary Care

## 2022-04-12 DIAGNOSIS — F411 Generalized anxiety disorder: Secondary | ICD-10-CM

## 2022-04-13 ENCOUNTER — Other Ambulatory Visit: Payer: Self-pay | Admitting: Primary Care

## 2022-04-13 DIAGNOSIS — R41840 Attention and concentration deficit: Secondary | ICD-10-CM

## 2022-04-13 DIAGNOSIS — F411 Generalized anxiety disorder: Secondary | ICD-10-CM

## 2022-04-13 DIAGNOSIS — F331 Major depressive disorder, recurrent, moderate: Secondary | ICD-10-CM

## 2022-05-02 ENCOUNTER — Other Ambulatory Visit: Payer: Self-pay | Admitting: Primary Care

## 2022-05-02 DIAGNOSIS — E1165 Type 2 diabetes mellitus with hyperglycemia: Secondary | ICD-10-CM

## 2022-05-25 ENCOUNTER — Other Ambulatory Visit: Payer: Self-pay | Admitting: Primary Care

## 2022-05-25 DIAGNOSIS — R41840 Attention and concentration deficit: Secondary | ICD-10-CM

## 2022-05-25 DIAGNOSIS — F331 Major depressive disorder, recurrent, moderate: Secondary | ICD-10-CM

## 2022-05-25 DIAGNOSIS — F411 Generalized anxiety disorder: Secondary | ICD-10-CM

## 2022-05-25 DIAGNOSIS — E1165 Type 2 diabetes mellitus with hyperglycemia: Secondary | ICD-10-CM

## 2022-05-25 MED ORDER — SERTRALINE HCL 100 MG PO TABS: 100.0000 mg | ORAL_TABLET | Freq: Every day | ORAL | 0 refills | Status: DC

## 2022-05-25 MED ORDER — OZEMPIC (0.25 OR 0.5 MG/DOSE) 2 MG/3ML ~~LOC~~ SOPN: 0.5000 mg | PEN_INJECTOR | SUBCUTANEOUS | 0 refills | Status: DC

## 2022-05-25 MED ORDER — SERTRALINE HCL 25 MG PO TABS: 25.0000 mg | ORAL_TABLET | Freq: Every day | ORAL | 0 refills | Status: DC

## 2022-05-25 NOTE — Telephone Encounter (Signed)
From: Cherylann Banas To: Office of Doreene Nest, NP Sent: 05/25/2022 2:03 PM EDT Subject: Medication Renewal Request  Refills have been requested for the following medications:   sertraline (ZOLOFT) 25 MG tablet [Joanthony Hamza K Verdun Rackley]   Semaglutide,0.25 or 0.5MG /DOS, (OZEMPIC, 0.25 OR 0.5 MG/DOSE,) 2 MG/3ML SOPN [Carlei Huang K Tamani Durney]  Preferred pharmacy: [facility] - [place], [place] - [address] Delivery method: Mail

## 2022-05-30 ENCOUNTER — Ambulatory Visit: Payer: 59 | Admitting: Primary Care

## 2022-06-07 ENCOUNTER — Other Ambulatory Visit: Payer: Self-pay | Admitting: Primary Care

## 2022-06-07 DIAGNOSIS — G43009 Migraine without aura, not intractable, without status migrainosus: Secondary | ICD-10-CM

## 2022-06-07 DIAGNOSIS — F411 Generalized anxiety disorder: Secondary | ICD-10-CM

## 2022-06-13 ENCOUNTER — Ambulatory Visit: Payer: 59 | Admitting: Primary Care

## 2022-06-29 ENCOUNTER — Ambulatory Visit: Payer: 59 | Admitting: Primary Care

## 2022-06-30 ENCOUNTER — Encounter: Payer: Self-pay | Admitting: Primary Care

## 2022-07-26 ENCOUNTER — Other Ambulatory Visit: Payer: Self-pay | Admitting: Primary Care

## 2022-07-26 DIAGNOSIS — E1165 Type 2 diabetes mellitus with hyperglycemia: Secondary | ICD-10-CM

## 2022-07-26 NOTE — Telephone Encounter (Signed)
Patient is due for CPE/follow up in August, this will be required prior to any further refills.  Please schedule, thank you!   

## 2022-07-26 NOTE — Telephone Encounter (Signed)
LVM for patient to c/b and schedule.  

## 2022-08-10 ENCOUNTER — Other Ambulatory Visit: Payer: Self-pay | Admitting: Primary Care

## 2022-08-10 DIAGNOSIS — F411 Generalized anxiety disorder: Secondary | ICD-10-CM

## 2022-08-10 DIAGNOSIS — G43009 Migraine without aura, not intractable, without status migrainosus: Secondary | ICD-10-CM

## 2022-08-11 NOTE — Telephone Encounter (Signed)
Patient is due for CPE/follow up in August, this will be required prior to any further refills.  Please schedule, thank you!   

## 2022-08-11 NOTE — Telephone Encounter (Signed)
Patient scheduled 08/18/22

## 2022-08-18 ENCOUNTER — Ambulatory Visit: Payer: 59 | Admitting: Primary Care

## 2022-08-18 ENCOUNTER — Encounter: Payer: Self-pay | Admitting: Primary Care

## 2022-08-18 VITALS — BP 110/70 | HR 79 | Temp 97.7°F | Ht 69.25 in | Wt 329.0 lb

## 2022-08-18 DIAGNOSIS — Z6841 Body Mass Index (BMI) 40.0 and over, adult: Secondary | ICD-10-CM

## 2022-08-18 DIAGNOSIS — F331 Major depressive disorder, recurrent, moderate: Secondary | ICD-10-CM | POA: Diagnosis not present

## 2022-08-18 DIAGNOSIS — G43009 Migraine without aura, not intractable, without status migrainosus: Secondary | ICD-10-CM

## 2022-08-18 DIAGNOSIS — E1165 Type 2 diabetes mellitus with hyperglycemia: Secondary | ICD-10-CM | POA: Diagnosis not present

## 2022-08-18 DIAGNOSIS — F411 Generalized anxiety disorder: Secondary | ICD-10-CM | POA: Diagnosis not present

## 2022-08-18 DIAGNOSIS — R41840 Attention and concentration deficit: Secondary | ICD-10-CM | POA: Diagnosis not present

## 2022-08-18 DIAGNOSIS — Z0000 Encounter for general adult medical examination without abnormal findings: Secondary | ICD-10-CM

## 2022-08-18 DIAGNOSIS — Z7985 Long-term (current) use of injectable non-insulin antidiabetic drugs: Secondary | ICD-10-CM

## 2022-08-18 LAB — COMPREHENSIVE METABOLIC PANEL
ALT: 9 U/L (ref 0–35)
AST: 15 U/L (ref 0–37)
Albumin: 4 g/dL (ref 3.5–5.2)
Alkaline Phosphatase: 60 U/L (ref 39–117)
BUN: 11 mg/dL (ref 6–23)
CO2: 24 mEq/L (ref 19–32)
Calcium: 9 mg/dL (ref 8.4–10.5)
Chloride: 104 mEq/L (ref 96–112)
Creatinine, Ser: 0.8 mg/dL (ref 0.40–1.20)
GFR: 99.33 mL/min (ref >60.00)
Glucose, Bld: 110 mg/dL — ABNORMAL HIGH (ref 70–99)
Potassium: 4.2 mEq/L (ref 3.5–5.1)
Sodium: 137 mEq/L (ref 135–145)
Total Bilirubin: 0.7 mg/dL (ref 0.2–1.2)
Total Protein: 6.3 g/dL (ref 6.0–8.3)

## 2022-08-18 LAB — LIPID PANEL
Cholesterol: 136 mg/dL (ref 0–200)
HDL: 29.2 mg/dL — ABNORMAL LOW (ref >39.00)
LDL Cholesterol: 87 mg/dL (ref 0–99)
NonHDL: 106.48
Total CHOL/HDL Ratio: 5
Triglycerides: 97 mg/dL (ref 0.0–149.0)
VLDL: 19.4 mg/dL (ref 0.0–40.0)

## 2022-08-18 LAB — HEMOGLOBIN A1C: Hgb A1c MFr Bld: 5.7 % (ref 4.6–6.5)

## 2022-08-18 MED ORDER — SEMAGLUTIDE (1 MG/DOSE) 4 MG/3ML ~~LOC~~ SOPN: 1.0000 mg | PEN_INJECTOR | SUBCUTANEOUS | 0 refills | Status: DC

## 2022-08-18 MED ORDER — SERTRALINE HCL 25 MG PO TABS: 25.0000 mg | ORAL_TABLET | Freq: Every day | ORAL | 3 refills | Status: DC

## 2022-08-18 MED ORDER — SERTRALINE HCL 100 MG PO TABS: 100.0000 mg | ORAL_TABLET | Freq: Every day | ORAL | 3 refills | Status: DC

## 2022-08-18 NOTE — Assessment & Plan Note (Signed)
Immunizations UTD. Pap smear UTD. Follows with GYN.  Discussed the importance of a healthy diet and regular exercise in order for weight loss, and to reduce the risk of further co-morbidity.  Exam stable. Labs pending.  Follow up in 1 year for repeat physical.  

## 2022-08-18 NOTE — Assessment & Plan Note (Signed)
Controlled.  Continue Zoloft 125 mg daily. Continue with therapy.

## 2022-08-18 NOTE — Assessment & Plan Note (Addendum)
Commended her on weight loss!  Increase Ozempic to 1 mg weekly as her weight has hit a plateau. Keep working on physical exercise.  Follow up in 3 months.

## 2022-08-18 NOTE — Progress Notes (Signed)
Subjective:    Patient ID: Samantha Ramos, female    DOB: 03/24/1990, 32 y.o.   MRN: [id number]  HPI  Samantha Ramos is a very pleasant 32 y.o. female who presents today for complete physical and follow up of chronic conditions.  Immunizations: -Tetanus: Completed in 2023  Diet: Fair diet.  Exercise: No regular exercise.  Eye exam: Completes annually  Dental exam: Completes semi-annually    Pap Smear: UTD, follows with GYN   BP Readings from Last 3 Encounters:  08/18/22 110/70  03/01/22 118/74  11/23/21 (!) 153/116    Wt Readings from Last 3 Encounters:  08/18/22 (!) 329 lb (149.2 kg)  03/01/22 (!) 342 lb (155.1 kg)  11/23/21 (!) 334 lb (151.5 kg)     Review of Systems  Constitutional:  Negative for unexpected weight change.  HENT:  Negative for rhinorrhea.   Eyes:  Negative for visual disturbance.  Respiratory:  Negative for cough and shortness of breath.   Cardiovascular:  Negative for chest pain.  Gastrointestinal:  Negative for constipation and diarrhea.  Genitourinary:  Negative for difficulty urinating and menstrual problem.  Musculoskeletal:  Negative for arthralgias and myalgias.  Skin:  Negative for rash.  Allergic/Immunologic: Negative for environmental allergies.  Neurological:  Negative for dizziness and headaches.  Psychiatric/Behavioral:  The patient is not nervous/anxious.          Past Medical History:  Diagnosis Date   Allergy    Anxiety    Depression    Diabetes mellitus without complication (HCC)    PCOS (polycystic ovarian syndrome)     Social History   Socioeconomic History   Marital status: Single    Spouse name: Not on file   Number of children: Not on file   Years of education: Not on file   Highest education level: Not on file  Occupational History   Not on file  Tobacco Use   Smoking status: Never   Smokeless tobacco: Never  Substance and Sexual Activity   Alcohol use: Yes    Alcohol/week: 0.0 standard drinks of  alcohol    Comment: occ   Drug use: No   Sexual activity: Yes    Birth control/protection: None  Other Topics Concern   Not on file  Social History Narrative         Diet: irregular eating, gatorade and chips at lunch, some fruits, and occ veggies   Parents divorced when she was 8, got some counseling then         Works as Ambulance person at [employer]      Social Determinants of Corporate investment banker Strain: Not on file  Food Insecurity: Not on file  Transportation Needs: Not on file  Physical Activity: Not on file  Stress: Not on file  Social Connections: Not on file  Intimate Partner Violence: Not on file    Past Surgical History:  Procedure Laterality Date   DENTAL SURGERY      Family History  Problem Relation Age of Onset   Hypertension Mother    Hyperlipidemia Mother    Depression Mother    Cholelithiasis Mother    Thyroid disease Mother    Hypertension Father    Diabetes Father    Diabetes Maternal Grandmother    Heart disease Maternal Grandmother        CVA   Diabetes Paternal Grandmother    Coronary artery disease Paternal Grandfather    Cancer Cousin        Leukemia  Allergies  Allergen Reactions   Penicillins     REACTION: Rash   Codeine     vomiting    Current Outpatient Medications on File Prior to Visit  Medication Sig Dispense Refill   blood glucose meter kit and supplies KIT Dispense based on patient and insurance preference. Use up to four times daily as directed. (FOR ICD-9 250.00, 250.01). 1 each 0   norethindrone (MICRONOR,CAMILA,ERRIN) 0.35 MG tablet Take 1 tablet by mouth daily.      propranolol ER (INDERAL LA) 80 MG 24 hr capsule TAKE 1 CAPSULE BY MOUTH AT  BEDTIME FOR HEADACHES 90 capsule 0   SUMAtriptan (IMITREX) 50 MG tablet Take 1 tablet by mouth at migraine onset. May repeat in 2 hours if headache persists or recurs. 10 tablet 0   No current facility-administered medications on file prior to visit.    BP 110/70 (BP  Location: Left Arm, Patient Position: Sitting, Cuff Size: Large)   Pulse 79   Temp 97.7 F (36.5 C)   Ht 5' 9.25" (1.759 m)   Wt (!) 329 lb (149.2 kg)   SpO2 98%   BMI 48.23 kg/m  Objective:   Physical Exam HENT:     Right Ear: Tympanic membrane and ear canal normal.     Left Ear: Tympanic membrane and ear canal normal.     Nose: Nose normal.  Eyes:     Conjunctiva/sclera: Conjunctivae normal.     Pupils: Pupils are equal, round, and reactive to light.  Neck:     Thyroid: No thyromegaly.  Cardiovascular:     Rate and Rhythm: Normal rate and regular rhythm.     Heart sounds: No murmur heard. Pulmonary:     Effort: Pulmonary effort is normal.     Breath sounds: Normal breath sounds. No rales.  Abdominal:     General: Bowel sounds are normal.     Palpations: Abdomen is soft.     Tenderness: There is no abdominal tenderness.  Musculoskeletal:        General: Normal range of motion.     Cervical back: Neck supple.  Lymphadenopathy:     Cervical: No cervical adenopathy.  Skin:    General: Skin is warm and dry.     Findings: No rash.  Neurological:     Mental Status: She is alert and oriented to person, place, and time.     Cranial Nerves: No cranial nerve deficit.     Deep Tendon Reflexes: Reflexes are normal and symmetric.  Psychiatric:        Mood and Affect: Mood normal.           Assessment & Plan:  Preventative health care Assessment & Plan: Immunizations UTD. Pap smear UTD. Follows with GYN  Discussed the importance of a healthy diet and regular exercise in order for weight loss, and to reduce the risk of further co-morbidity.  Exam stable. Labs pending.  Follow up in 1 year for repeat physical.    Generalized anxiety disorder Assessment & Plan: Controlled.  Continue Zoloft 125 mg daily. Continue with therapy.  Orders: -     Sertraline HCl; Take 1 tablet (100 mg total) by mouth daily. for anxiety and depression.  Dispense: 90 tablet; Refill:  3 -     Sertraline HCl; Take 1 tablet (25 mg total) by mouth daily. For anxiety. Take with 100 mg dose.  Dispense: 90 tablet; Refill: 3  Difficulty concentrating -     Sertraline HCl; Take 1 tablet (  100 mg total) by mouth daily. for anxiety and depression.  Dispense: 90 tablet; Refill: 3  Moderate episode of recurrent major depressive disorder (HCC) Assessment & Plan: Controlled.  Continue Zoloft 125 mg daily. Continue with therapy.   Continue to monitor.   Orders: -     Sertraline HCl; Take 1 tablet (100 mg total) by mouth daily. for anxiety and depression.  Dispense: 90 tablet; Refill: 3  Migraine without aura and without status migrainosus, not intractable Assessment & Plan: Controlled.  No concerns today. Continue propranolol ER 80 mg daily for prevention and sumatriptan 50 mg PRN.  Continue to monitor.    Type 2 diabetes mellitus with hyperglycemia, without long-term current use of insulin (HCC) Assessment & Plan: Glucose readings ranging low 100s.  Increase Ozempic 1 mg weekly as weight has hit a plateau.  Continue to work healthy diet and walking.   Repeat A1C pending. Foot exam today.  Follow up in 6 months.  Orders: -     Semaglutide (1 MG/DOSE); Inject 1 mg as directed once a week. for diabetes.  Dispense: 9 mL; Refill: 0 -     Lipid panel -     Hemoglobin A1c -     Comprehensive metabolic panel  Morbid obesity with BMI of 45.0-49.9, adult [facility]) Assessment & Plan: Commended her on weight loss!  Increase Ozempic to 1 mg weekly as her weight has hit a plateau. Keep working on physical exercise.  Follow up in 3 months.         Doreene Nest, NP

## 2022-08-18 NOTE — Assessment & Plan Note (Signed)
Glucose readings ranging low 100s.  Increase Ozempic 1 mg weekly as weight has hit a plateau.  Continue to work healthy diet and walking.   Repeat A1C pending. Foot exam today.  Follow up in 6 months.

## 2022-08-18 NOTE — Assessment & Plan Note (Signed)
Controlled.  Continue Zoloft 125 mg daily. Continue with therapy.   Continue to monitor.

## 2022-08-18 NOTE — Patient Instructions (Signed)
Stop by the lab prior to leaving today. I will notify you of your results once received.   We increased your Ozempic to 1 mg weekly for diabetes and weight loss.  Please schedule a follow up visit for 3 months for weight check.  It was a pleasure to see you today!

## 2022-08-18 NOTE — Assessment & Plan Note (Signed)
Controlled.  No concerns today. Continue propranolol ER 80 mg daily for prevention and sumatriptan 50 mg PRN.  Continue to monitor.

## 2022-10-17 ENCOUNTER — Other Ambulatory Visit: Payer: Self-pay | Admitting: Primary Care

## 2022-10-17 DIAGNOSIS — G43009 Migraine without aura, not intractable, without status migrainosus: Secondary | ICD-10-CM

## 2022-10-17 DIAGNOSIS — F411 Generalized anxiety disorder: Secondary | ICD-10-CM

## 2022-10-19 ENCOUNTER — Other Ambulatory Visit: Payer: Self-pay | Admitting: Primary Care

## 2022-10-19 DIAGNOSIS — E1165 Type 2 diabetes mellitus with hyperglycemia: Secondary | ICD-10-CM

## 2022-11-07 ENCOUNTER — Other Ambulatory Visit: Payer: Self-pay | Admitting: Primary Care

## 2022-11-07 DIAGNOSIS — E1165 Type 2 diabetes mellitus with hyperglycemia: Secondary | ICD-10-CM

## 2022-11-07 MED ORDER — SEMAGLUTIDE (1 MG/DOSE) 4 MG/3ML ~~LOC~~ SOPN: 1.0000 mg | PEN_INJECTOR | SUBCUTANEOUS | 0 refills | Status: DC

## 2022-11-21 ENCOUNTER — Ambulatory Visit: Payer: 59 | Admitting: Primary Care

## 2022-11-22 ENCOUNTER — Encounter: Payer: Self-pay | Admitting: Primary Care

## 2023-01-08 ENCOUNTER — Other Ambulatory Visit: Payer: Self-pay | Admitting: Primary Care

## 2023-01-08 DIAGNOSIS — E1165 Type 2 diabetes mellitus with hyperglycemia: Secondary | ICD-10-CM

## 2023-01-19 ENCOUNTER — Ambulatory Visit: Payer: 59 | Admitting: Primary Care

## 2023-01-30 ENCOUNTER — Ambulatory Visit: Payer: 59 | Admitting: Primary Care

## 2023-01-31 ENCOUNTER — Encounter: Payer: Self-pay | Admitting: Primary Care

## 2023-05-04 ENCOUNTER — Ambulatory Visit: Admitting: Primary Care

## 2023-08-11 ENCOUNTER — Other Ambulatory Visit: Payer: Self-pay | Admitting: Primary Care

## 2023-08-11 DIAGNOSIS — F411 Generalized anxiety disorder: Secondary | ICD-10-CM

## 2023-08-11 DIAGNOSIS — G43009 Migraine without aura, not intractable, without status migrainosus: Secondary | ICD-10-CM

## 2023-08-11 DIAGNOSIS — R41840 Attention and concentration deficit: Secondary | ICD-10-CM

## 2023-08-11 DIAGNOSIS — F331 Major depressive disorder, recurrent, moderate: Secondary | ICD-10-CM

## 2023-08-12 NOTE — Telephone Encounter (Signed)
 Patient is due for CPE/follow up, this will be required prior to any further refills.  Please schedule, thank you!

## 2023-08-24 ENCOUNTER — Encounter: Admitting: Primary Care

## 2023-09-12 ENCOUNTER — Ambulatory Visit (INDEPENDENT_AMBULATORY_CARE_PROVIDER_SITE_OTHER): Admitting: Primary Care

## 2023-09-12 ENCOUNTER — Encounter: Payer: Self-pay | Admitting: Primary Care

## 2023-09-12 VITALS — BP 130/78 | HR 71 | Temp 97.2°F | Ht 69.25 in | Wt 358.0 lb

## 2023-09-12 DIAGNOSIS — Z6841 Body Mass Index (BMI) 40.0 and over, adult: Secondary | ICD-10-CM

## 2023-09-12 DIAGNOSIS — Z0000 Encounter for general adult medical examination without abnormal findings: Secondary | ICD-10-CM

## 2023-09-12 DIAGNOSIS — E66813 Obesity, class 3: Secondary | ICD-10-CM

## 2023-09-12 DIAGNOSIS — E1165 Type 2 diabetes mellitus with hyperglycemia: Secondary | ICD-10-CM

## 2023-09-12 DIAGNOSIS — F330 Major depressive disorder, recurrent, mild: Secondary | ICD-10-CM

## 2023-09-12 DIAGNOSIS — G43009 Migraine without aura, not intractable, without status migrainosus: Secondary | ICD-10-CM | POA: Diagnosis not present

## 2023-09-12 DIAGNOSIS — F411 Generalized anxiety disorder: Secondary | ICD-10-CM

## 2023-09-12 DIAGNOSIS — Z0001 Encounter for general adult medical examination with abnormal findings: Secondary | ICD-10-CM

## 2023-09-12 MED ORDER — OZEMPIC (0.25 OR 0.5 MG/DOSE) 2 MG/3ML ~~LOC~~ SOPN: PEN_INJECTOR | SUBCUTANEOUS | 0 refills | Status: AC

## 2023-09-12 NOTE — Assessment & Plan Note (Signed)
 Controlled.  Continue Zoloft  100 mg daily

## 2023-09-12 NOTE — Assessment & Plan Note (Signed)
 Improved. Continue Zoloft  100 mg daily.

## 2023-09-12 NOTE — Assessment & Plan Note (Signed)
 Immunizations UTD. Declines influenza vaccine. Pap smear UTD. Follows with GYN  Discussed the importance of a healthy diet and regular exercise in order for weight loss, and to reduce the risk of further co-morbidity.  Exam stable. Labs pending.  Follow up in 1 year for repeat physical.

## 2023-09-12 NOTE — Assessment & Plan Note (Signed)
 We had a long discussion today regarding her BMI and obesity.  She is contemplating bariatric surgery, declines consultation referral today.  We will start by resuming her Ozempic .  She will get back on track with her lifestyle  Close follow-up in 3 months.

## 2023-09-12 NOTE — Assessment & Plan Note (Signed)
 Repeat A1C pending.  Will resume Ozempic  to assist with diabetes control and weight loss.  Urine microalbumin pending.  Start semaglutide  (Ozempic ) for diabetes. Start by injecting 0.25 mg into the skin once weekly for 4 weeks, then increase to 0.5 mg once weekly thereafter.   Follow up in 3 months.

## 2023-09-12 NOTE — Assessment & Plan Note (Signed)
 Uncontrolled. Also with frequent headaches.   Continue propranolol  ER 80 mg daily for prevention.   Will have her take Imitrex  50 mg during her next headache to see if this helps to abort her migraine. If no improvement then increase Imitrex  dose to 100 mg vs increase propranolol  to 120 mg daily.  Will obtain MRI brain for further evaluation. Orders placed.   She will update.

## 2023-09-12 NOTE — Progress Notes (Signed)
 Subjective:    Patient ID: Samantha Ramos, female    DOB: 1990-04-19, 33 y.o.   MRN: [id number]  Ryah Cribb is a very pleasant 33 y.o. female who presents today for complete physical and follow up of chronic conditions.  Over the last 2 weeks she's noticed waking up with pounding headaches to the frontal lobes. Lasts for hours, will take a Tylenol , goes back to sleep, symptoms resolve until the next day. She denies blurred vision, dizziness. She's been taking propranolol  Er 80 mg daily which has historically worked. She denies increased caffeine use, changes to medications, blurred vision, dizziness. She does have photophobia and phonophobia and nausea. She has no headache now. She has not taken Imitrex .      Immunizations: -Tetanus: Completed in 2023 -Influenza: Declines  -HPV: Unsure. Follows with GYN. She will ask  Diet: Fair diet.  Exercise: No regular exercise.  Eye exam: Completes annually  Dental exam: Completes semi-annually    Pap Smear: Completes per GYN and is UTD  BP Readings from Last 3 Encounters:  09/12/23 130/78  08/18/22 110/70  03/01/22 118/74    Wt Readings from Last 3 Encounters:  09/12/23 (!) 358 lb (162.4 kg)  08/18/22 (!) 329 lb (149.2 kg)  03/01/22 (!) 342 lb (155.1 kg)      Review of Systems  Constitutional:  Negative for unexpected weight change.  HENT:  Negative for rhinorrhea.   Respiratory:  Negative for cough and shortness of breath.   Cardiovascular:  Negative for chest pain.  Gastrointestinal:  Negative for constipation and diarrhea.  Genitourinary:  Negative for difficulty urinating.  Musculoskeletal:  Negative for arthralgias and myalgias.  Skin:  Negative for rash.  Allergic/Immunologic: Negative for environmental allergies.  Neurological:  Negative for dizziness, numbness and headaches.  Psychiatric/Behavioral:  The patient is not nervous/anxious.          Past Medical History:  Diagnosis Date   Allergy    Anxiety     Depression    Diabetes mellitus without complication (HCC)    PCOS (polycystic ovarian syndrome)     Social History   Socioeconomic History   Marital status: Single    Spouse name: Not on file   Number of children: Not on file   Years of education: Not on file   Highest education level: Not on file  Occupational History   Not on file  Tobacco Use   Smoking status: Never   Smokeless tobacco: Never  Substance and Sexual Activity   Alcohol use: Yes    Alcohol/week: 0.0 standard drinks of alcohol    Comment: occ   Drug use: No   Sexual activity: Yes    Birth control/protection: None  Other Topics Concern   Not on file  Social History Narrative         Diet: irregular eating, gatorade and chips at lunch, some fruits, and occ veggies   Parents divorced when she was 8, got some counseling then         Works as Ambulance person at [employer]      Social Drivers of Corporate investment banker Strain: Not on file  Food Insecurity: Not on file  Transportation Needs: Not on file  Physical Activity: Not on file  Stress: Not on file  Social Connections: Not on file  Intimate Partner Violence: Not on file    Past Surgical History:  Procedure Laterality Date   DENTAL SURGERY      Family History  Problem Relation  Age of Onset   Hypertension Mother    Hyperlipidemia Mother    Depression Mother    Cholelithiasis Mother    Thyroid  disease Mother    Hypertension Father    Diabetes Father    Diabetes Maternal Grandmother    Heart disease Maternal Grandmother        CVA   Diabetes Paternal Grandmother    Coronary artery disease Paternal Grandfather    Cancer Cousin        Leukemia    Allergies  Allergen Reactions   Penicillins     REACTION: Rash   Codeine      vomiting    Current Outpatient Medications on File Prior to Visit  Medication Sig Dispense Refill   propranolol  ER (INDERAL  LA) 80 MG 24 hr capsule TAKE 1 CAPSULE BY MOUTH AT  BEDTIME FOR HEADACHES 30  capsule 0   sertraline  (ZOLOFT ) 100 MG tablet TAKE 1 TABLET BY MOUTH DAILY FOR ANXIETY AND DEPRESSION 30 tablet 0   SUMAtriptan  (IMITREX ) 50 MG tablet Take 1 tablet by mouth at migraine onset. May repeat in 2 hours if headache persists or recurs. 10 tablet 0   blood glucose meter kit and supplies KIT Dispense based on patient and insurance preference. Use up to four times daily as directed. (FOR ICD-9 250.00, 250.01). 1 each 0   No current facility-administered medications on file prior to visit.    BP 130/78   Pulse 71   Temp (!) 97.2 F (36.2 C) (Temporal)   Ht 5' 9.25 (1.759 m)   Wt (!) 358 lb (162.4 kg)   SpO2 98%   BMI 52.49 kg/m  Objective:   Physical Exam HENT:     Right Ear: Tympanic membrane and ear canal normal.     Left Ear: Tympanic membrane and ear canal normal.  Eyes:     Pupils: Pupils are equal, round, and reactive to light.  Cardiovascular:     Rate and Rhythm: Normal rate and regular rhythm.  Pulmonary:     Effort: Pulmonary effort is normal.     Breath sounds: Normal breath sounds.  Abdominal:     General: Bowel sounds are normal.     Palpations: Abdomen is soft.     Tenderness: There is no abdominal tenderness.  Musculoskeletal:        General: Normal range of motion.     Cervical back: Neck supple.  Skin:    General: Skin is warm and dry.  Neurological:     Mental Status: She is alert and oriented to person, place, and time.     Cranial Nerves: No cranial nerve deficit.     Deep Tendon Reflexes:     Reflex Scores:      Patellar reflexes are 2+ on the right side and 2+ on the left side. Psychiatric:        Mood and Affect: Mood normal.     Physical Exam        Assessment & Plan:  Encounter for annual general medical examination with abnormal findings in adult Assessment & Plan: Immunizations UTD. Declines influenza vaccine.  Pap smear UTD.  Follows with GYN  Discussed the importance of a healthy diet and regular exercise in order for  weight loss, and to reduce the risk of further co-morbidity.  Exam stable. Labs pending.  Follow up in 1 year for repeat physical.    Migraine without aura and without status migrainosus, not intractable Assessment & Plan: Uncontrolled. Also with frequent headaches.  Continue propranolol  ER 80 mg daily for prevention.   Will have her take Imitrex  50 mg during her next headache to see if this helps to abort her migraine. If no improvement then increase Imitrex  dose to 100 mg vs increase propranolol  to 120 mg daily.  Will obtain MRI brain for further evaluation. Orders placed.   She will update.  Orders: -     MR BRAIN WO CONTRAST; Future  Type 2 diabetes mellitus with hyperglycemia, without long-term current use of insulin (HCC) Assessment & Plan: Repeat A1C pending.  Will resume Ozempic  to assist with diabetes control and weight loss.  Urine microalbumin pending.  Start semaglutide  (Ozempic ) for diabetes. Start by injecting 0.25 mg into the skin once weekly for 4 weeks, then increase to 0.5 mg once weekly thereafter.   Follow up in 3 months.   Orders: -     Ozempic  (0.25 or 0.5 MG/DOSE); Inject 0.25 mg into the skin once weekly for 4 weeks, then increase to 0.5 mg once weekly thereafter for diabetes.  Dispense: 6 mL; Refill: 0 -     Microalbumin / creatinine urine ratio -     Lipid panel -     Hemoglobin A1c -     Comprehensive metabolic panel with GFR  Mild episode of recurrent major depressive disorder [facility]) Assessment & Plan: Controlled!  Continue Zoloft  100 mg daily.    Generalized anxiety disorder Assessment & Plan: Improved!  Continue Zoloft  100 mg daily.   Class 3 severe obesity due to excess calories with body mass index (BMI) of 50.0 to 59.9 in adult Assessment & Plan: We had a long discussion today regarding her BMI and obesity.  She is contemplating bariatric surgery, declines consultation referral today.  We will start by resuming her  Ozempic .  She will get back on track with her lifestyle  Close follow-up in 3 months.     Assessment and Plan Assessment & Plan         Comer MARLA Gaskins, NP    Discussed the use of AI scribe software for clinical note transcription with the patient, who gave verbal consent to proceed.  History of Present Illness

## 2023-09-12 NOTE — Patient Instructions (Signed)
 Start semaglutide  (Ozempic ) for diabetes. Start by injecting 0.25 mg into the skin once weekly for 4 weeks, then increase to 0.5 mg once weekly thereafter.   Stop by the lab prior to leaving today. I will notify you of your results once received.   Take your Imitrex  for migraine the next time you experience a headache.  You will receive a phone call regarding the MRI.  Please schedule a follow up visit for 3 months.  It was a pleasure to see you today!

## 2023-09-13 ENCOUNTER — Ambulatory Visit: Payer: Self-pay | Admitting: Primary Care

## 2023-09-13 LAB — HEMOGLOBIN A1C: Hgb A1c MFr Bld: 6.9 % — ABNORMAL HIGH (ref 4.6–6.5)

## 2023-09-13 LAB — LIPID PANEL
Cholesterol: 148 mg/dL (ref 0–200)
HDL: 32.5 mg/dL — ABNORMAL LOW (ref >39.00)
LDL Cholesterol: 84 mg/dL (ref 0–99)
NonHDL: 115.32
Total CHOL/HDL Ratio: 5
Triglycerides: 155 mg/dL — ABNORMAL HIGH (ref 0.0–149.0)
VLDL: 31 mg/dL (ref 0.0–40.0)

## 2023-09-13 LAB — COMPREHENSIVE METABOLIC PANEL WITH GFR
ALT: 10 U/L (ref 0–35)
AST: 15 U/L (ref 0–37)
Albumin: 3.8 g/dL (ref 3.5–5.2)
Alkaline Phosphatase: 58 U/L (ref 39–117)
BUN: 11 mg/dL (ref 6–23)
CO2: 29 meq/L (ref 19–32)
Calcium: 8.5 mg/dL (ref 8.4–10.5)
Chloride: 101 meq/L (ref 96–112)
Creatinine, Ser: 0.8 mg/dL (ref 0.40–1.20)
GFR: 98.58 mL/min (ref >60.00)
Glucose, Bld: 162 mg/dL — ABNORMAL HIGH (ref 70–99)
Potassium: 4.2 meq/L (ref 3.5–5.1)
Sodium: 139 meq/L (ref 135–145)
Total Bilirubin: 0.6 mg/dL (ref 0.2–1.2)
Total Protein: 6.2 g/dL (ref 6.0–8.3)

## 2023-09-13 LAB — MICROALBUMIN / CREATININE URINE RATIO
Creatinine,U: 182.9 mg/dL
Microalb Creat Ratio: 4.6 mg/g (ref 0.0–30.0)
Microalb, Ur: 0.8 mg/dL (ref 0.0–1.9)

## 2023-09-19 DIAGNOSIS — E1165 Type 2 diabetes mellitus with hyperglycemia: Secondary | ICD-10-CM

## 2023-09-19 MED ORDER — BLOOD GLUCOSE TEST STRIPS 333 VI STRP: ORAL_STRIP | 0 refills | Status: AC

## 2023-09-23 ENCOUNTER — Other Ambulatory Visit

## 2023-09-24 ENCOUNTER — Other Ambulatory Visit: Payer: Self-pay | Admitting: Primary Care

## 2023-09-24 DIAGNOSIS — F331 Major depressive disorder, recurrent, moderate: Secondary | ICD-10-CM

## 2023-09-24 DIAGNOSIS — F411 Generalized anxiety disorder: Secondary | ICD-10-CM

## 2023-09-24 DIAGNOSIS — G43009 Migraine without aura, not intractable, without status migrainosus: Secondary | ICD-10-CM

## 2023-09-24 DIAGNOSIS — R41840 Attention and concentration deficit: Secondary | ICD-10-CM

## 2023-10-17 DIAGNOSIS — E1165 Type 2 diabetes mellitus with hyperglycemia: Secondary | ICD-10-CM

## 2023-10-17 MED ORDER — FREESTYLE LIBRE 3 PLUS SENSOR MISC: 1 refills | Status: AC

## 2023-10-18 ENCOUNTER — Telehealth: Payer: Self-pay

## 2023-10-18 ENCOUNTER — Other Ambulatory Visit (HOSPITAL_COMMUNITY): Payer: Self-pay

## 2023-10-18 NOTE — Telephone Encounter (Signed)
 Pharmacy Patient Advocate Encounter  Received notification from [employer] that Prior Authorization for [facility] 3 plus sensor has been APPROVED from 10/18/23 to 10/17/24. Ran test claim, Copay is $224.99 for 3 month supply. This test claim was processed through [facility]- copay amounts may vary at other pharmacies due to pharmacy/plan contracts, or as the patient moves through the different stages of their insurance plan.   PA #/Case ID/Reference #: [id number]

## 2023-10-18 NOTE — Telephone Encounter (Signed)
 Pharmacy Patient Advocate Encounter   Received notification from Onbase that prior authorization for [facility] 3 plus sensor is required/requested.   Insurance verification completed.   The patient is insured through [facility].   Per test claim: PA required; PA submitted to above mentioned insurance via Latent Key/confirmation #/EOC [id number] Status is pending

## 2023-11-03 ENCOUNTER — Other Ambulatory Visit: Payer: Self-pay | Admitting: Primary Care

## 2023-11-03 DIAGNOSIS — E1165 Type 2 diabetes mellitus with hyperglycemia: Secondary | ICD-10-CM

## 2023-11-13 ENCOUNTER — Ambulatory Visit: Admitting: Family Medicine

## 2023-11-13 ENCOUNTER — Encounter: Payer: Self-pay | Admitting: Family Medicine

## 2023-11-13 ENCOUNTER — Ambulatory Visit: Payer: Self-pay

## 2023-11-13 VITALS — BP 136/90 | HR 83 | Resp 16 | Wt 355.6 lb

## 2023-11-13 DIAGNOSIS — Z7985 Long-term (current) use of injectable non-insulin antidiabetic drugs: Secondary | ICD-10-CM

## 2023-11-13 DIAGNOSIS — E1165 Type 2 diabetes mellitus with hyperglycemia: Secondary | ICD-10-CM | POA: Diagnosis not present

## 2023-11-13 DIAGNOSIS — J069 Acute upper respiratory infection, unspecified: Secondary | ICD-10-CM | POA: Diagnosis not present

## 2023-11-13 MED ORDER — FLUTICASONE PROPIONATE 50 MCG/ACT NA SUSP: 2.0000 | Freq: Every day | NASAL | 0 refills | Status: DC

## 2023-11-13 NOTE — Telephone Encounter (Signed)
 Noted. Agree with nursing triage decision. Sophronia Beams, NP evaluation.

## 2023-11-13 NOTE — Telephone Encounter (Signed)
 FYI Only or Action Required?: FYI only for provider: appointment scheduled on 11/13/23.  Patient was last seen in primary care on 09/12/2023 by Gretta Comer POUR, NP.  Called Nurse Triage reporting Nasal Congestion and Blood Sugar Problem.  Symptoms began several days ago.  Interventions attempted: Other: breakfast.  Symptoms are: gradually worsening.  Triage Disposition: See HCP Within 4 Hours (Or PCP Triage)  Patient/caregiver understands and will follow disposition?: Yes          Copied from CRM [id number]. Topic: Clinical - Red Word Triage >> Nov 13, 2023  9:01 AM Ahlexyia S wrote: [facility] that prompted transfer to Nurse Triage: Pt called in stating that she is having chest congestion, runny nose and her blood sugar keeps dropping. Pt current blood sugar is 68. Warm transferred to nurse triage. Reason for Disposition  [1] Blood glucose 70 mg/dL (3.9 mmol/L) or below OR symptomatic, now improved with Care Advice AND [2] cause unknown  [1] Fever > 100 F (37.8 C) AND [2] diabetes mellitus or weak immune system (e.g., HIV positive, cancer chemo, splenectomy, organ transplant, chronic steroids)  Answer Assessment - Initial Assessment Questions 1. SYMPTOMS: What symptoms are you concerned about?     Low BS, congestion, low grade fever 100.2 2. ONSET:  When did the symptoms start?     Sunday 3. BLOOD GLUCOSE: What is your blood glucose level?      72  now 4. USUAL RANGE: What is your blood glucose level usually? (e.g., usual fasting morning value, usual evening value)     Usual fasting AM - 74-86 5. TYPE 1 or 2:  Do you know what type of diabetes you have?  (e.g., Type 1, Type 2, Gestational; doesn't know)      Pt endorses only have CBG monitor but does not take any meds. Type 2  6. INSULIN: Do you take insulin? What type of insulin(s) do you use? What is the mode of delivery? (syringe, pen; injection or pump) When did you last give yourself an insulin dose? (i.e.,  time or hours/minutes ago) How much did you give? (i.e., how many units)     N/a 7. DIABETES PILLS: Do you take any pills for your diabetes? If Yes, ask: What is the name of the medicine(s) that you take for high blood sugar?     N/a 8. OTHER SYMPTOMS: Do you have any symptoms? (e.g., fever, frequent urination, difficulty breathing, vomiting)     denies 9. LOW BLOOD GLUCOSE TREATMENT: What have you done so far to treat the low blood glucose level?     Ate breakfast 10. FOOD: When did you last eat or drink?       Just now 11. ALONE: Are you alone right now or is someone with you?        alone 12. PREGNANCY: Is there any chance you are pregnant? When was your last menstrual period?       *No Answer*  Protocols used: Diabetes - Low Blood Sugar-A-AH, Sinus Pain or Congestion-A-AH

## 2023-11-13 NOTE — Progress Notes (Signed)
 Acute Office Visit  Introduced to nurse practitioner role and practice setting.  All questions answered.  Discussed provider/patient relationship and expectations.   Subjective:     Patient ID: Samantha Ramos, female    DOB: 1991/01/06, 33 y.o.   MRN: [id number]  Chief Complaint  Patient presents with   URI    Pt stating that she is having chest congestion, runny nose, had a sore throat this is better. Patient reports that a fever last night highest of 102.5 temperature this morning back to normal. Patient took Nyquil and Advil PM around 4:30 AM today to help with a headache. Patient took a Flu and Covid test at home last night results- negative.   Blood Sugar Problem    Patient reports blood sugar keeps dropping. Pt current blood sugar at home this morning was 66 when she was on the phone with NT. Reports that at 7:34 AM her sugar level was 53.  She ate breakfast and is at 126.    Discussed the use of AI scribe software for clinical note transcription with the patient, who gave verbal consent to proceed.  History of Present Illness Samantha Ramos is a 33 year old female with type 2 diabetes who presents with congestion and runny nose.  Symptoms began on Sunday with congestion, runny nose, and sore throat, which has since improved. Fevers were present but have returned to normal as of this morning. At-home flu and COVID tests taken last night were negative. Her primary concern now is a constantly running nose and a new cough that developed today. She has been using Robitussin for her cough and sore throat, and has also taken DayQuil and NyQuil.  She has a history of type 2 diabetes and is currently on Ozempic , although she did not take it this week due to her illness. She experiences episodes of hypoglycemia, with a recent episode of 53 mg/dL that improved to 873 mg/dL after consuming a peppermint and a glucose shake. She reports not feeling hungry over the weekend and has been eating less  frequently. She typically eats a light breakfast and an afternoon snack, but has not been consistent with meals recently. She uses a continuous glucose monitor (CGM) and occasionally checks her blood sugar with a finger stick for accuracy. Her blood sugar has been dropping throughout the night.  No chest pain, difficulty breathing, nausea, vomiting, or syncope. She has a history of asthma in childhood but denies any current wheezing. No known exposure to others who are sick. Her fever has resolved with medication. She denies any ear pain or significant ear infections recently, although she has a history of ear infections in the past. She uses Afrin for congestion.     HPI  ROS      Objective:    BP (!) 136/90 (BP Location: Right Wrist, Patient Position: Sitting, Cuff Size: Normal)   Pulse 83   Resp 16   Wt (!) 355 lb 9.6 oz (161.3 kg)   SpO2 97%   BMI 52.13 kg/m    Physical Exam Constitutional:      General: She is not in acute distress.    Appearance: She is well-developed. She is obese. She is not ill-appearing, toxic-appearing or diaphoretic.  HENT:     Head: Normocephalic.     Right Ear: Tympanic membrane and ear canal normal. No drainage, swelling or tenderness. No middle ear effusion. Tympanic membrane is not erythematous.     Left Ear: Tympanic membrane and ear canal  normal. No drainage, swelling or tenderness.  No middle ear effusion. Tympanic membrane is not erythematous.     Nose: Congestion and rhinorrhea present.     Right Sinus: No maxillary sinus tenderness or frontal sinus tenderness.     Left Sinus: No maxillary sinus tenderness or frontal sinus tenderness.     Mouth/Throat:     Mouth: Mucous membranes are moist. No oral lesions.     Pharynx: Uvula midline. Posterior oropharyngeal erythema present. No pharyngeal swelling, oropharyngeal exudate or uvula swelling.     Tonsils: No tonsillar exudate or tonsillar abscesses. 0 on the right. 0 on the left.  Eyes:      Extraocular Movements:     Right eye: Normal extraocular motion.     Left eye: Normal extraocular motion.     Conjunctiva/sclera: Conjunctivae normal.     Pupils: Pupils are equal, round, and reactive to light.  Cardiovascular:     Rate and Rhythm: Normal rate and regular rhythm.     Pulses: Normal pulses.     Heart sounds: Normal heart sounds. No murmur heard.    No gallop.  Pulmonary:     Effort: Pulmonary effort is normal. No respiratory distress.     Breath sounds: Normal breath sounds. No stridor. No wheezing, rhonchi or rales.  Chest:     Chest wall: No tenderness.  Lymphadenopathy:     Cervical: Cervical adenopathy present.  Skin:    General: Skin is warm and dry.     Capillary Refill: Capillary refill takes less than 2 seconds.  Neurological:     General: No focal deficit present.     Mental Status: She is alert and oriented to person, place, and time. Mental status is at baseline.  Psychiatric:        Mood and Affect: Mood normal.        Behavior: Behavior normal.        Thought Content: Thought content normal.        Judgment: Judgment normal.     No results found for any visits on 11/13/23.      Assessment & Plan:  Assessment and Plan Assessment & Plan Acute upper respiratory infection Symptoms began on Sunday with congestion, rhinorrhea, sore throat, and fever, which has resolved. Negative flu and COVID at home tests. Symptoms are improving, with no current chest pain, dyspnea, nausea, vomiting, or syncope. No current need for antibiotics as symptoms are viral in nature. - Continue Robitussin for cough and sore throat. - Use DayQuil and NyQuil as needed for symptom relief. - Take acetaminophen  or ibuprofen for pain and fever management. - Use hot tea with honey and saltwater gargles for throat discomfort. - Take hot showers to loosen secretions. - Use nasal saline spray to help with nasal congestion. - Prescribed Flonase  for nasal inflammation. - Monitor  symptoms; if symptoms persist beyond 7-10 days, consider antibiotics.  Type 2 diabetes mellitus  - Recommend seeking further advice and care with PCP - associated with recurrent hypoglycemia - including a blood sugar of 53 mg/dL.  - Currently on Ozempic  0.5 mg, - pt supposed to administer on 11/11/23 but self held d/t illness - Blood sugars have been fluctuating- even prior to illness -  No insulin use.  - Symptoms of hypoglycemia include clamminess, shakiness, and nausea.  - CGM in use for monitoring. \ - Discussed the importance of maintaining blood sugar levels with frequent small meals containing carbohydrates and protein.  - Pt can have more in  depth conversation with PCP - Recommend pt continue to take Ozempic  as prescribed by PCP - Consider a snack before bed to prevent nocturnal hypoglycemia. - Monitor blood sugar levels with CGM and compare with blood finger stick for accuracy. - Maintain a dietary journal to track food intake and correlate with blood sugar levels. - Follow up with PCP on December 3rd for further evaluation and management.  Problem List Items Addressed This Visit       Endocrine   Type 2 diabetes mellitus with hyperglycemia, without long-term current use of insulin (HCC)   Other Visit Diagnoses       Viral upper respiratory tract infection    -  Primary   Relevant Medications   fluticasone  (FLONASE ) 50 MCG/ACT nasal spray       Meds ordered this encounter  Medications   fluticasone  (FLONASE ) 50 MCG/ACT nasal spray    Sig: Place 2 sprays into both nostrils daily.    Dispense:  16 g    Refill:  0    Return if symptoms worsen or fail to improve.  Curtis DELENA Boom, FNP  I, Curtis DELENA Boom, FNP, have reviewed all documentation for this visit. The documentation on 11/13/23 for the exam, diagnosis, procedures, and orders are all accurate and complete.    Preventing Hypoglycemia Hypoglycemia is when the amount of sugar, or glucose, in your blood is  too low. Low blood sugar can happen if you have diabetes or if you don't have diabetes. It may be an emergency. Work with your health care provider to make and change your meal plan as needed. This can help prevent low blood sugar. What can increase my risk? You may be more likely to get low blood sugar if: You take insulin or other diabetes medicines. You skip or delay a meal or snack. You get sick. How can low blood sugar affect me? Mild symptoms Mild cases may not cause symptoms. If you do have symptoms, they may include: Hunger or feeling like you may vomit. Sweating and feeling cold to the touch. Feeling dizzy or light-headed. Being sleepy or having trouble sleeping. A fast heart rate. A headache. Blurry eyesight. Mood changes. These include feeling worried, nervous, or easily annoyed. Tingling or numbness around your mouth, lips, or tongue. If a mild case of low blood sugar isn't treated, it can become moderate or severe. Moderate symptoms If you have a moderate case, you may: Feel confused. Have changes in the way you act or move. Feel weak. Have an uneven heartbeat. Severe symptoms Having very low blood sugar is an emergency. It can cause: Fainting. Seizures. A coma. Death. What nutrition changes can I make? Work with your provider or an expert in healthy eating called a dietitian to make a meal plan. Eat meals at set times. Have snacks between meals, as told by your provider. Donot skip or delay meals or snacks. What other actions can I take to prevent low blood sugar?  Work closely with your provider to manage your blood sugar. Make sure you know: What your blood sugar should be. How and when to check your blood sugar. The symptoms of low blood sugar. Be sure to eat food when you drink alcohol. When you're sick, check your blood sugar more often. Make a sick day plan in advance with your provider. Follow this plan when you can't eat or drink like normal. Always  check your blood sugar before, during, and after exercise. How is this treated? Treating low blood  sugar If you have low blood sugar, eat or drink something with sugar in it right away. The food or drink should have 15 grams of a fast-acting carbohydrate (carb). Options include: 4 oz (120 mL) of fruit juice. 4 oz (120 mL) of soda (not diet soda). A few pieces of hard candy. Check food labels to see how many pieces to eat. 1 Tbsp (15 mL) of sugar or honey. 4 glucose tablets. 1 tube of glucose gel. Treating low blood sugar if you have diabetes If you're alert and can swallow safely, follow the 15:15 rule: Take 15 grams of a fast-acting carb. Talk with your provider about how much carb you should take. Check your blood sugar 15 minutes after you take the carb. If your blood sugar is still at or below 70 mg/dL (3.9 mmol/L), take 15 grams of a carb again. If your blood sugar doesn't go above 70 mg/dL (3.9 mmol/L) after 3 tries, get help right away. After your blood sugar goes back to normal, eat a meal or a snack within 1 hour. Treating very low blood sugar If your blood sugar is less than 54 mg/dL (3 mmol/L), it's an emergency. Get help right away. If you can't eat or drink, you will need to be given glucagon. A family member or friend should learn how to check your blood sugar and give you glucagon. Ask your provider if you should keep a glucagon kit at home. You may also need to be treated in a hospital. Where to find more information American Diabetes Association (ADA): diabetes.[employer] of Diabetes and Digestive and Kidney Diseases (NIDDK): stagesync.si Association of Diabetes Care & Education Specialists: diabeteseducator.org Contact a health care provider if: You have diabetes and are having trouble keeping your blood sugar in the right range. You have low blood sugar often. Get help right away if: You can't get your blood sugar above 70 mg/dL (3.9 mmol/L) after 3  tries. Your blood sugar is below 54 mg/dL (3 mmol/L). You faint. You have a seizure. These symptoms may be an emergency. Call 911 right away. Do not wait to see if the symptoms will go away. Do not drive yourself to the hospital. This information is not intended to replace advice given to you by your health care provider. Make sure you discuss any questions you have with your health care provider. Document Revised: 03/16/2022 Document Reviewed: 03/16/2022 Elsevier Patient Education  2024 [employer].

## 2023-12-12 ENCOUNTER — Ambulatory Visit: Admitting: Primary Care

## 2023-12-25 ENCOUNTER — Ambulatory Visit: Admitting: Primary Care

## 2023-12-25 ENCOUNTER — Encounter: Payer: Self-pay | Admitting: Primary Care

## 2023-12-25 ENCOUNTER — Ambulatory Visit: Payer: Self-pay | Admitting: Primary Care

## 2023-12-25 VITALS — BP 120/82 | HR 61 | Temp 98.0°F | Ht 69.25 in | Wt 361.5 lb

## 2023-12-25 DIAGNOSIS — E66813 Obesity, class 3: Secondary | ICD-10-CM | POA: Diagnosis not present

## 2023-12-25 DIAGNOSIS — E1165 Type 2 diabetes mellitus with hyperglycemia: Secondary | ICD-10-CM | POA: Diagnosis not present

## 2023-12-25 DIAGNOSIS — Z6841 Body Mass Index (BMI) 40.0 and over, adult: Secondary | ICD-10-CM | POA: Diagnosis not present

## 2023-12-25 DIAGNOSIS — G43009 Migraine without aura, not intractable, without status migrainosus: Secondary | ICD-10-CM

## 2023-12-25 DIAGNOSIS — R0683 Snoring: Secondary | ICD-10-CM | POA: Diagnosis not present

## 2023-12-25 LAB — POCT GLYCOSYLATED HEMOGLOBIN (HGB A1C): Hemoglobin A1C: 6.4 % — AB (ref 4.0–5.6)

## 2023-12-25 NOTE — Assessment & Plan Note (Signed)
 Will resume Ozempic .   Referral placed for bariatric surgery consult.

## 2023-12-25 NOTE — Patient Instructions (Signed)
 You will either be contacted via phone regarding your referral to bariatric surgery and pulmonology, or you may receive a letter on your MyChart portal from our referral team with instructions for scheduling an appointment. Please let us  know if you have not been contacted by anyone within two weeks.  Resume Ozempic  at 0.5 mg weekly for 1 month.  Update me after that.   Please schedule a follow up visit for 3 months.  It was a pleasure to see you today!

## 2023-12-25 NOTE — Progress Notes (Signed)
 Subjective:    Patient ID: Samantha Ramos, female    DOB: Feb 26, 1990, 33 y.o.   MRN: [id number]  Samantha Ramos is a very pleasant 33 y.o. female with a history of type 2 diabetes, migraines, PCOS who presents today for follow-up of diabetes. She would also like to discuss insomnia.  1) Type 2 Diabetes:  Current medications include: Ozempic  0.5 mg weekly. She has not taken her Ozempic  since the second week of November.     Last A1C: 6.9 in September 2025, 6.4 today Last Eye Exam: Due Last Foot Exam: Due Pneumonia Vaccination: Never completed Urine Microalbumin: UTD Statin:None  Dietary changes since last visit: Increasing water intake, limiting sodas, stop eating when feeling full.  She would like to learn more about bariatric surgery.    Exercise: None, active  Wt Readings from Last 3 Encounters:  12/25/23 (!) 361 lb 8 oz (164 kg)  11/13/23 (!) 355 lb 9.6 oz (161.3 kg)  09/12/23 (!) 358 lb (162.4 kg)   2) Sleep Disturbance: Chronic for the last few years. Difficulty falling and staying asleep. She wakes during the night, she does snore, she experiences daytime tiredness. Her fiance has witnessed periods of apnea during sleep. She has never undergone a sleep study. She does wake during the night with headaches. She has an adjustable bed and props herself up for sleep =.    Review of Systems  Respiratory:  Negative for shortness of breath.   Cardiovascular:  Negative for chest pain.  Gastrointestinal:  Negative for abdominal pain and nausea.  Neurological:  Negative for numbness.         Past Medical History:  Diagnosis Date   Allergy    Anxiety    Depression    Diabetes mellitus without complication (HCC)    PCOS (polycystic ovarian syndrome)     Social History   Socioeconomic History   Marital status: Single    Spouse name: Not on file   Number of children: Not on file   Years of education: Not on file   Highest education level: Not on file  Occupational  History   Not on file  Tobacco Use   Smoking status: Never   Smokeless tobacco: Never  Substance and Sexual Activity   Alcohol use: Yes    Alcohol/week: 0.0 standard drinks of alcohol    Comment: occ   Drug use: No   Sexual activity: Yes    Birth control/protection: None  Other Topics Concern   Not on file  Social History Narrative         Diet: irregular eating, gatorade and chips at lunch, some fruits, and occ veggies   Parents divorced when she was 8, got some counseling then         Works as ambulance person at [employer]      Social Drivers of Health   Tobacco Use: Low Risk (12/25/2023)   Patient History    Smoking Tobacco Use: Never    Smokeless Tobacco Use: Never    Passive Exposure: Not on file  Financial Resource Strain: Not on file  Food Insecurity: Not on file  Transportation Needs: Not on file  Physical Activity: Not on file  Stress: Not on file  Social Connections: Not on file  Intimate Partner Violence: Not on file  Depression (EYV7-0): Medium Risk (12/25/2023)   Depression (PHQ2-9)    PHQ-2 Score: 9  Alcohol Screen: Not on file  Housing: Not on file  Utilities: Not on file  Health  Literacy: Not on file    Past Surgical History:  Procedure Laterality Date   DENTAL SURGERY      Family History  Problem Relation Age of Onset   Hypertension Mother    Hyperlipidemia Mother    Depression Mother    Cholelithiasis Mother    Thyroid  disease Mother    Hypertension Father    Diabetes Father    Diabetes Maternal Grandmother    Heart disease Maternal Grandmother        CVA   Diabetes Paternal Grandmother    Coronary artery disease Paternal Grandfather    Cancer Cousin        Leukemia    Allergies[1]  Medications Ordered Prior to Encounter[2]  BP 120/82   Pulse 61   Temp 98 F (36.7 C) (Oral)   Ht 5' 9.25 (1.759 m)   Wt (!) 361 lb 8 oz (164 kg)   LMP 12/24/2023   SpO2 95%   BMI 53.00 kg/m  Objective:   Physical Exam Cardiovascular:      Rate and Rhythm: Normal rate and regular rhythm.  Pulmonary:     Effort: Pulmonary effort is normal.     Breath sounds: Normal breath sounds.  Musculoskeletal:     Cervical back: Neck supple.  Skin:    General: Skin is warm and dry.  Neurological:     Mental Status: She is alert and oriented to person, place, and time.  Psychiatric:        Mood and Affect: Mood normal.     Physical Exam        Assessment & Plan:  Type 2 diabetes mellitus with hyperglycemia, without long-term current use of insulin (HCC) Assessment & Plan: Improved with A1c of 6.4 today  Will resume Ozempic  at 0.5 mg weekly with plans to titrate upward thereafter .She will update in 1 month.  Follow-up in 3 months.  Orders: -     POCT glycosylated hemoglobin (Hb A1C)  Snoring -     Pulmonary Visit  Class 3 severe obesity due to excess calories without serious comorbidity with body mass index (BMI) of 50.0 to 59.9 in adult [facility]) Assessment & Plan: Will resume Ozempic .   Referral placed for bariatric surgery consult.   Orders: -     Amb Referral to Bariatric Surgery  Migraine without aura and without status migrainosus, not intractable Assessment & Plan: Improved overall. Do suspect that intermittent headaches could be secondary to sleep apnea Referral placed to pulmonology.  MRI brain pending for which she will complete     Assessment and Plan Assessment & Plan         Samantha MARLA Gaskins, NP       [1]  Allergies Allergen Reactions   Penicillins     REACTION: Rash   Codeine      vomiting  [2]  Current Outpatient Medications on File Prior to Visit  Medication Sig Dispense Refill   blood glucose meter kit and supplies KIT Dispense based on patient and insurance preference. Use up to four times daily as directed. (FOR ICD-9 250.00, 250.01). 1 each 0   Continuous Glucose Sensor (FREESTYLE LIBRE 3 PLUS SENSOR) MISC Use to check blood sugar continuously. Change sensor  every 15 days. 6 each 1   Glucose Blood (BLOOD GLUCOSE TEST STRIPS 333) STRP Use to check blood sugar once daily. 300 strip 0   propranolol  ER (INDERAL  LA) 80 MG 24 hr capsule TAKE 1 CAPSULE BY MOUTH AT  BEDTIME FOR HEADACHES  90 capsule 2   Semaglutide ,0.25 or 0.5MG /DOS, (OZEMPIC , 0.25 OR 0.5 MG/DOSE,) 2 MG/3ML SOPN Inject 0.25 mg into the skin once weekly for 4 weeks, then increase to 0.5 mg once weekly thereafter for diabetes. 6 mL 0   sertraline  (ZOLOFT ) 100 MG tablet TAKE 1 TABLET BY MOUTH DAILY FOR ANXIETY AND DEPRESSION 90 tablet 2   SUMAtriptan  (IMITREX ) 50 MG tablet Take 1 tablet by mouth at migraine onset. May repeat in 2 hours if headache persists or recurs. 10 tablet 0   No current facility-administered medications on file prior to visit.

## 2023-12-25 NOTE — Assessment & Plan Note (Signed)
 Improved overall. Do suspect that intermittent headaches could be secondary to sleep apnea Referral placed to pulmonology.  MRI brain pending for which she will complete

## 2023-12-25 NOTE — Assessment & Plan Note (Signed)
 Improved with A1c of 6.4 today  Will resume Ozempic  at 0.5 mg weekly with plans to titrate upward thereafter .She will update in 1 month.  Follow-up in 3 months.

## 2024-01-11 ENCOUNTER — Ambulatory Visit: Admitting: Sleep Medicine

## 2024-01-23 ENCOUNTER — Ambulatory Visit: Admitting: Sleep Medicine

## 2024-02-11 ENCOUNTER — Ambulatory Visit: Admitting: Sleep Medicine

## 2024-02-22 ENCOUNTER — Ambulatory Visit: Admitting: Sleep Medicine
